# Patient Record
Sex: Female | Born: 1965 | Race: White | Hispanic: No | Marital: Married | Smoking: Never smoker
Health system: Southern US, Community
[De-identification: ages and names within clinical notes are randomized; demographics above are authoritative.]

## PROBLEM LIST (undated history)

## (undated) DIAGNOSIS — R51 Headache: Secondary | ICD-10-CM

## (undated) DIAGNOSIS — N390 Urinary tract infection, site not specified: Secondary | ICD-10-CM

## (undated) DIAGNOSIS — R002 Palpitations: Secondary | ICD-10-CM

## (undated) DIAGNOSIS — I491 Atrial premature depolarization: Secondary | ICD-10-CM

## (undated) DIAGNOSIS — R12 Heartburn: Secondary | ICD-10-CM

## (undated) DIAGNOSIS — R9431 Abnormal electrocardiogram [ECG] [EKG]: Secondary | ICD-10-CM

## (undated) DIAGNOSIS — I493 Ventricular premature depolarization: Secondary | ICD-10-CM

## (undated) DIAGNOSIS — R519 Headache, unspecified: Secondary | ICD-10-CM

## (undated) DIAGNOSIS — H9319 Tinnitus, unspecified ear: Secondary | ICD-10-CM

## (undated) DIAGNOSIS — R5383 Other fatigue: Secondary | ICD-10-CM

## (undated) DIAGNOSIS — I442 Atrioventricular block, complete: Secondary | ICD-10-CM

## (undated) HISTORY — DX: Headache, unspecified: R51.9

## (undated) HISTORY — DX: Palpitations: R00.2

## (undated) HISTORY — DX: Atrioventricular block, complete: I44.2

## (undated) HISTORY — DX: Tinnitus, unspecified ear: H93.19

## (undated) HISTORY — DX: Abnormal electrocardiogram (ECG) (EKG): R94.31

## (undated) HISTORY — DX: Atrial premature depolarization: I49.1

## (undated) HISTORY — PX: ABLATION: SHX5711

## (undated) HISTORY — PX: CHOLECYSTECTOMY: SHX55

## (undated) HISTORY — PX: DILATION AND CURETTAGE OF UTERUS: SHX78

## (undated) HISTORY — DX: Headache: R51

## (undated) HISTORY — DX: Ventricular premature depolarization: I49.3

## (undated) HISTORY — DX: Other fatigue: R53.83

## (undated) HISTORY — DX: Heartburn: R12

---

## 1998-07-22 ENCOUNTER — Emergency Department (HOSPITAL_COMMUNITY): Admission: EM | Admit: 1998-07-22 | Discharge: 1998-07-22 | Payer: Self-pay | Admitting: Emergency Medicine

## 2000-03-05 ENCOUNTER — Emergency Department (HOSPITAL_COMMUNITY): Admission: EM | Admit: 2000-03-05 | Discharge: 2000-03-05 | Payer: Self-pay | Admitting: Emergency Medicine

## 2000-04-06 ENCOUNTER — Encounter: Admission: RE | Admit: 2000-04-06 | Discharge: 2000-04-06 | Payer: Self-pay | Admitting: Family Medicine

## 2000-04-06 ENCOUNTER — Encounter: Payer: Self-pay | Admitting: Family Medicine

## 2000-12-01 ENCOUNTER — Encounter: Admission: RE | Admit: 2000-12-01 | Discharge: 2000-12-01 | Payer: Self-pay | Admitting: General Surgery

## 2000-12-01 ENCOUNTER — Encounter: Payer: Self-pay | Admitting: General Surgery

## 2001-01-02 ENCOUNTER — Emergency Department (HOSPITAL_COMMUNITY): Admission: EM | Admit: 2001-01-02 | Discharge: 2001-01-02 | Payer: Self-pay | Admitting: Emergency Medicine

## 2001-07-02 ENCOUNTER — Emergency Department (HOSPITAL_COMMUNITY): Admission: EM | Admit: 2001-07-02 | Discharge: 2001-07-03 | Payer: Self-pay | Admitting: *Deleted

## 2001-12-05 ENCOUNTER — Encounter: Payer: Self-pay | Admitting: General Surgery

## 2001-12-05 ENCOUNTER — Encounter: Admission: RE | Admit: 2001-12-05 | Discharge: 2001-12-05 | Payer: Self-pay | Admitting: General Surgery

## 2003-01-24 ENCOUNTER — Encounter: Payer: Self-pay | Admitting: Emergency Medicine

## 2003-01-24 ENCOUNTER — Inpatient Hospital Stay (HOSPITAL_COMMUNITY): Admission: EM | Admit: 2003-01-24 | Discharge: 2003-01-24 | Payer: Self-pay | Admitting: Emergency Medicine

## 2003-11-21 ENCOUNTER — Encounter: Admission: RE | Admit: 2003-11-21 | Discharge: 2003-11-21 | Payer: Self-pay | Admitting: General Surgery

## 2005-04-08 ENCOUNTER — Encounter: Admission: RE | Admit: 2005-04-08 | Discharge: 2005-04-08 | Payer: Self-pay | Admitting: General Surgery

## 2005-04-16 ENCOUNTER — Encounter: Admission: RE | Admit: 2005-04-16 | Discharge: 2005-04-16 | Payer: Self-pay | Admitting: General Surgery

## 2006-02-09 ENCOUNTER — Emergency Department (HOSPITAL_COMMUNITY): Admission: EM | Admit: 2006-02-09 | Discharge: 2006-02-09 | Payer: Self-pay | Admitting: Emergency Medicine

## 2006-06-20 ENCOUNTER — Encounter: Admission: RE | Admit: 2006-06-20 | Discharge: 2006-06-20 | Payer: Self-pay | Admitting: General Surgery

## 2006-06-29 ENCOUNTER — Encounter: Admission: RE | Admit: 2006-06-29 | Discharge: 2006-06-29 | Payer: Self-pay | Admitting: General Surgery

## 2006-11-03 ENCOUNTER — Emergency Department (HOSPITAL_COMMUNITY): Admission: EM | Admit: 2006-11-03 | Discharge: 2006-11-03 | Payer: Self-pay | Admitting: Emergency Medicine

## 2006-12-25 ENCOUNTER — Emergency Department (HOSPITAL_COMMUNITY): Admission: EM | Admit: 2006-12-25 | Discharge: 2006-12-25 | Payer: Self-pay | Admitting: Emergency Medicine

## 2006-12-26 ENCOUNTER — Emergency Department (HOSPITAL_COMMUNITY): Admission: EM | Admit: 2006-12-26 | Discharge: 2006-12-26 | Payer: Self-pay | Admitting: Emergency Medicine

## 2007-11-23 ENCOUNTER — Encounter: Admission: RE | Admit: 2007-11-23 | Discharge: 2007-11-23 | Payer: Self-pay | Admitting: Internal Medicine

## 2009-03-23 ENCOUNTER — Emergency Department (HOSPITAL_BASED_OUTPATIENT_CLINIC_OR_DEPARTMENT_OTHER): Admission: EM | Admit: 2009-03-23 | Discharge: 2009-03-23 | Payer: Self-pay | Admitting: Emergency Medicine

## 2009-03-27 ENCOUNTER — Encounter (HOSPITAL_COMMUNITY): Admission: RE | Admit: 2009-03-27 | Discharge: 2009-05-22 | Payer: Self-pay | Admitting: Ophthalmology

## 2009-06-18 ENCOUNTER — Encounter: Admission: RE | Admit: 2009-06-18 | Discharge: 2009-06-18 | Payer: Self-pay | Admitting: Internal Medicine

## 2010-10-11 ENCOUNTER — Encounter: Payer: Self-pay | Admitting: General Surgery

## 2011-02-05 NOTE — H&P (Signed)
NAME:  Kelsey Torres, Kelsey Torres                        ACCOUNT NO.:  [id number]   MEDICAL RECORD NO.:  [id number]                   PATIENT TYPE:  INP   LOCATION:  2016                                 FACILITY:  [facility]   PHYSICIAN:  Cristy Hilts. Jacinto Halim, M.D.                  DATE OF BIRTH:  1965-12-29   DATE OF ADMISSION:  01/23/2003  DATE OF DISCHARGE:  01/24/2003                                HISTORY & PHYSICAL   CHIEF COMPLAINT:  Chest pain.   HISTORY OF PRESENT ILLNESS:  The patient is a 45 year old female with no  prior history of coronary disease who presented to the emergency room at  [facility] last evening with epigastric pain.  She had  been having symptoms off and on for a week or two but these had worsened in  the last two to three days.  She describes epigastric burning that would go  up into her throat and into her back.  Yesterday she had some tingling in  her arms.  This concerned her and she presented to the emergency room.  In  the emergency room, the emergency room physician felt that her EKG had some  abnormalities with possibility of inferior ST depression.  She was admitted  to rule out MI.   PAST MEDICAL HISTORY:  This is unremarkable for diabetes or hyperlipidemia  or hypertension.  She has had gallbladder surgery in 1998.  She had a  thyroid biopsy a few weeks that was normal.   CURRENT MEDICATION:  Claritin D p.r.n.   ALLERGIES:  She has allergies to PENICILLIN, SULFA and CODEINE.   SOCIAL HISTORY:  She is married.  She has two children.  She does not work.   HABITS:  She does not smoke and does not drink.   FAMILY HISTORY:  This is unremarkable for coronary disease, both parents are  alive and well without serious medical problems.  Her grandmother does have  diabetes.   REVIEW OF SYMPTOMS:  This is essentially unremarkable except for as noted  above.  She does have chronic seasonal allergies.  She was to see Dr. Katrinka Blazing  in [place], [place], today regarding these symptoms she has been  having for the last two weeks with epigastric discomfort.   PHYSICAL EXAMINATION:  GENERAL APPEARANCE:  She is a well-developed, well-  nourished female in no acute distress.  VITAL SIGNS:  Stable.  Blood pressure 135/78, pulse 80, respiratory rate 16,  temperature 97.2, weight 135.  HEENT:  Normocephalic.  Extraocular movements are intact.  Sclerae are  anicteric.  Lids and conjunctivae are within normal limits.  NECK:  There are no bruits, no JVD.  CHEST:  Clear to auscultation and percussion.  CARDIOVASCULAR:  There is regular rate and rhythm without murmurs, rubs, or  gallops.  Normal S1 and S2.  ABDOMEN:  Nontender.  No hepatosplenomegaly is appreciated.  EXTREMITIES:  No edema.  Distal pulses are intact.   EKG reveals sinus rhythm with suggestion of subtle ST depression and  nonspecific ST changes.   LABORATORY DATA:  D-dimer is less than 0.22.  CK-MB and troponin are  negative x2.  White count 10.9, hemoglobin 13.5, hematocrit 40.3, platelets  234.  Sodium 136, potassium 3.3, BUN 9, creatinine 0.8.  Liver functions are  normal.  Lipase 21.   Chest x-ray was done in the emergency room and the report is not available  at the time of this dictation.   IMPRESSION:  1. Chest pain, more consistent with gastroesophageal reflux than coronary     disease.  2. Abnormal EKG with nonspecific ST changes.   PLAN:  The patient was seen by Dr. Jacinto Halim and myself today.  She will be sent  to our office for an outpatient Cardiolite study today.  We did add a PPI.     Abelino Derrick, P.A.                      Cristy Hilts. Jacinto Halim, M.D.    Lenard Lance  D:  01/24/2003  T:  01/25/2003  Job:  [id number]   cc:   Dellie Burns, M.D.  [place], [place]

## 2011-02-05 NOTE — Discharge Summary (Signed)
   NAME:  Kelsey Torres, Kelsey Torres                        ACCOUNT NO.:  [id number]   MEDICAL RECORD NO.:  [id number]                   PATIENT TYPE:  INP   LOCATION:  2016                                 FACILITY:  [facility]   PHYSICIAN:  Cristy Hilts. Jacinto Halim, M.D.                  DATE OF BIRTH:  February 28, 1966   DATE OF ADMISSION:  01/23/2003  DATE OF DISCHARGE:  01/24/2003                                 DISCHARGE SUMMARY   ADDENDUM:  As follows, apparently the patient never did have a chest x-ray  in the emergency room.      Abelino Derrick, P.A.                      Cristy Hilts. Jacinto Halim, M.D.    Lenard Lance  D:  01/24/2003  T:  01/25/2003  Job:  [id number]

## 2011-03-12 ENCOUNTER — Other Ambulatory Visit: Payer: Self-pay | Admitting: Obstetrics and Gynecology

## 2011-03-12 DIAGNOSIS — Z1231 Encounter for screening mammogram for malignant neoplasm of breast: Secondary | ICD-10-CM

## 2011-03-16 ENCOUNTER — Ambulatory Visit
Admission: RE | Admit: 2011-03-16 | Discharge: 2011-03-16 | Disposition: A | Discharge status: Home / Self Care | Payer: BC Managed Care – PPO | Source: Ambulatory Visit | Attending: Obstetrics and Gynecology | Admitting: Obstetrics and Gynecology

## 2011-03-16 ENCOUNTER — Ambulatory Visit: Payer: Self-pay

## 2011-03-16 DIAGNOSIS — Z1231 Encounter for screening mammogram for malignant neoplasm of breast: Secondary | ICD-10-CM

## 2013-12-03 ENCOUNTER — Other Ambulatory Visit: Payer: Self-pay

## 2013-12-03 ENCOUNTER — Ambulatory Visit
Admission: RE | Admit: 2013-12-03 | Discharge: 2013-12-03 | Disposition: A | Discharge status: Home / Self Care | Payer: BC Managed Care – PPO | Source: Ambulatory Visit

## 2013-12-03 DIAGNOSIS — Z1231 Encounter for screening mammogram for malignant neoplasm of breast: Secondary | ICD-10-CM

## 2014-07-06 DIAGNOSIS — Z658 Other specified problems related to psychosocial circumstances: Secondary | ICD-10-CM

## 2014-07-06 DIAGNOSIS — Z789 Other specified health status: Secondary | ICD-10-CM | POA: Insufficient documentation

## 2014-07-06 DIAGNOSIS — N393 Stress incontinence (female) (male): Secondary | ICD-10-CM | POA: Insufficient documentation

## 2014-07-06 DIAGNOSIS — M6281 Muscle weakness (generalized): Secondary | ICD-10-CM | POA: Insufficient documentation

## 2014-12-25 ENCOUNTER — Emergency Department (HOSPITAL_BASED_OUTPATIENT_CLINIC_OR_DEPARTMENT_OTHER)
Admission: EM | Admit: 2014-12-25 | Discharge: 2014-12-25 | Disposition: A | Discharge status: Home / Self Care | Payer: BLUE CROSS/BLUE SHIELD | Attending: Emergency Medicine | Admitting: Emergency Medicine

## 2014-12-25 ENCOUNTER — Encounter (HOSPITAL_BASED_OUTPATIENT_CLINIC_OR_DEPARTMENT_OTHER): Payer: Self-pay | Admitting: Emergency Medicine

## 2014-12-25 DIAGNOSIS — Z3202 Encounter for pregnancy test, result negative: Secondary | ICD-10-CM | POA: Diagnosis not present

## 2014-12-25 DIAGNOSIS — R319 Hematuria, unspecified: Secondary | ICD-10-CM

## 2014-12-25 DIAGNOSIS — N390 Urinary tract infection, site not specified: Secondary | ICD-10-CM | POA: Diagnosis not present

## 2014-12-25 DIAGNOSIS — Z880 Allergy status to penicillin: Secondary | ICD-10-CM | POA: Diagnosis not present

## 2014-12-25 DIAGNOSIS — R350 Frequency of micturition: Secondary | ICD-10-CM | POA: Diagnosis present

## 2014-12-25 HISTORY — DX: Urinary tract infection, site not specified: N39.0

## 2014-12-25 LAB — URINE MICROSCOPIC-ADD ON

## 2014-12-25 LAB — URINALYSIS, ROUTINE W REFLEX MICROSCOPIC
Glucose, UA: NEGATIVE mg/dL
KETONES UR: 15 mg/dL — AB
NITRITE: NEGATIVE
PH: 5.5 (ref 5.0–8.0)
PROTEIN: 100 mg/dL — AB
Specific Gravity, Urine: 1.018 (ref 1.005–1.030)
Urobilinogen, UA: 1 mg/dL (ref 0.0–1.0)

## 2014-12-25 LAB — PREGNANCY, URINE: Preg Test, Ur: NEGATIVE

## 2014-12-25 MED ORDER — NITROFURANTOIN MONOHYD MACRO 100 MG PO CAPS: 100.0000 mg | ORAL_CAPSULE | Freq: Two times a day (BID) | ORAL | Status: DC

## 2014-12-25 MED ORDER — NITROFURANTOIN MONOHYD MACRO 100 MG PO CAPS
100.0000 mg | ORAL_CAPSULE | Freq: Once | ORAL | Status: AC
  Administered 2014-12-25: 100 mg via ORAL
  Filled 2014-12-25: qty 1

## 2014-12-25 MED ORDER — PHENAZOPYRIDINE HCL 100 MG PO TABS
200.0000 mg | ORAL_TABLET | Freq: Once | ORAL | Status: AC
  Administered 2014-12-25: 200 mg via ORAL
  Filled 2014-12-25: qty 2

## 2014-12-25 MED ORDER — PHENAZOPYRIDINE HCL 200 MG PO TABS: 200.0000 mg | ORAL_TABLET | Freq: Three times a day (TID) | ORAL | Status: DC

## 2014-12-25 NOTE — Discharge Instructions (Signed)
Urinary Tract Infection °Urinary tract infections (UTIs) can develop anywhere along your urinary tract. Your urinary tract is your body's drainage system for removing wastes and extra water. Your urinary tract includes two kidneys, two ureters, a bladder, and a urethra. Your kidneys are a pair of bean-shaped organs. Each kidney is about the size of your fist. They are located below your ribs, one on each side of your spine. °CAUSES °Infections are caused by microbes, which are microscopic organisms, including fungi, viruses, and bacteria. These organisms are so small that they can only be seen through a microscope. Bacteria are the microbes that most commonly cause UTIs. °SYMPTOMS  °Symptoms of UTIs may vary by age and gender of the patient and by the location of the infection. Symptoms in young women typically include a frequent and intense urge to urinate and a painful, burning feeling in the bladder or urethra during urination. Older women and men are more likely to be tired, shaky, and weak and have muscle aches and abdominal pain. A fever may mean the infection is in your kidneys. Other symptoms of a kidney infection include pain in your back or sides below the ribs, nausea, and vomiting. °DIAGNOSIS °To diagnose a UTI, your caregiver will ask you about your symptoms. Your caregiver also will ask to provide a urine sample. The urine sample will be tested for bacteria and white blood cells. White blood cells are made by your body to help fight infection. °TREATMENT  °Typically, UTIs can be treated with medication. Because most UTIs are caused by a bacterial infection, they usually can be treated with the use of antibiotics. The choice of antibiotic and length of treatment depend on your symptoms and the type of bacteria causing your infection. °HOME CARE INSTRUCTIONS °· If you were prescribed antibiotics, take them exactly as your caregiver instructs you. Finish the medication even if you feel better after you  have only taken some of the medication. °· Drink enough water and fluids to keep your urine clear or pale yellow. °· Avoid caffeine, tea, and carbonated beverages. They tend to irritate your bladder. °· Empty your bladder often. Avoid holding urine for long periods of time. °· Empty your bladder before and after sexual intercourse. °· After a bowel movement, women should cleanse from front to back. Use each tissue only once. °SEEK MEDICAL CARE IF:  °· You have back pain. °· You develop a fever. °· Your symptoms do not begin to resolve within 3 days. °SEEK IMMEDIATE MEDICAL CARE IF:  °· You have severe back pain or lower abdominal pain. °· You develop chills. °· You have nausea or vomiting. °· You have continued burning or discomfort with urination. °MAKE SURE YOU:  °· Understand these instructions. °· Will watch your condition. °· Will get help right away if you are not doing well or get worse. °Document Released: 06/16/2005 Document Revised: 03/07/2012 Document Reviewed: 10/15/2011 °ExitCare® Patient Information ©2015 [employer]. This information is not intended to replace advice given to you by your health care provider. Make sure you discuss any questions you have with your health care provider. ° °Nitrofurantoin tablets or capsules °What is this medicine? °NITROFURANTOIN (nye troe fyoor AN toyn) is an antibiotic. It is used to treat urinary tract infections. °This medicine may be used for other purposes; ask your health care provider or pharmacist if you have questions. °COMMON BRAND NAME(S): Macrobid, Macrodantin, Urotoin °What should I tell my health care provider before I take this medicine? °They need to   know if you have any of these conditions: -anemia -diabetes -glucose-6-phosphate dehydrogenase deficiency -kidney disease -liver disease -lung disease -other chronic illness -an unusual or allergic reaction to nitrofurantoin, other antibiotics, other medicines, foods, dyes or  preservatives -pregnant or trying to get pregnant -breast-feeding How should I use this medicine? Take this medicine by mouth with a glass of water. Follow the directions on the prescription label. Take this medicine with food or milk. Take your doses at regular intervals. Do not take your medicine more often than directed. Do not stop taking except on your doctor's advice. Talk to your pediatrician regarding the use of this medicine in children. While this drug may be prescribed for selected conditions, precautions do apply. Overdosage: If you think you have taken too much of this medicine contact a poison control center or emergency room at once. NOTE: This medicine is only for you. Do not share this medicine with others. What if I miss a dose? If you miss a dose, take it as soon as you can. If it is almost time for your next dose, take only that dose. Do not take double or extra doses. What may interact with this medicine? -antacids containing magnesium trisilicate -probenecid -quinolone antibiotics like ciprofloxacin, lomefloxacin, norfloxacin and ofloxacin -sulfinpyrazone This list may not describe all possible interactions. Give your health care provider a list of all the medicines, herbs, non-prescription drugs, or dietary supplements you use. Also tell them if you smoke, drink alcohol, or use illegal drugs. Some items may interact with your medicine. What should I watch for while using this medicine? Tell your doctor or health care professional if your symptoms do not improve or if you get new symptoms. Drink several glasses of water a day. If you are taking this medicine for a long time, visit your doctor for regular checks on your progress. If you are diabetic, you may get a false positive result for sugar in your urine with certain brands of urine tests. Check with your doctor. What side effects may I notice from receiving this medicine? Side effects that you should report to your  doctor or health care professional as soon as possible: -allergic reactions like skin rash or hives, swelling of the face, lips, or tongue -chest pain -cough -difficulty breathing -dizziness, drowsiness -fever or infection -joint aches or pains -pale or blue-tinted skin -redness, blistering, peeling or loosening of the skin, including inside the mouth -tingling, burning, pain, or numbness in hands or feet -unusual bleeding or bruising -unusually weak or tired -yellowing of eyes or skin Side effects that usually do not require medical attention (report to your doctor or health care professional if they continue or are bothersome): -dark urine -diarrhea -headache -loss of appetite -nausea or vomiting -temporary hair loss This list may not describe all possible side effects. Call your doctor for medical advice about side effects. You may report side effects to FDA at 1-800-FDA-1088. Where should I keep my medicine? Keep out of the reach of children. Store at room temperature between 15 and 30 degrees C (59 and 86 degrees F). Protect from light. Throw away any unused medicine after the expiration date. NOTE: This sheet is a summary. It may not cover all possible information. If you have questions about this medicine, talk to your doctor, pharmacist, or health care provider.  2015, Elsevier/Gold Standard. (2008-03-27 15:56:47)  Phenazopyridine tablets What is this medicine? PHENAZOPYRIDINE (fen az oh PEER i deen) is a pain reliever. It is used to stop the pain,  burning, or discomfort caused by infection or irritation of the urinary tract. This medicine is not an antibiotic. It will not cure a urinary tract infection. This medicine may be used for other purposes; ask your health care provider or pharmacist if you have questions. COMMON BRAND NAME(S): AZO, Azo-100, Azo-Gesic, Azo-Septic, Azo-Standard, Phenazo, Prodium, Pyridium, Urinary Analgesic, Uristat What should I tell my health care  provider before I take this medicine? They need to know if you have any of these conditions: -glucose-6-phosphate dehydrogenase (G6PD) deficiency -kidney disease -an unusual or allergic reaction to phenazopyridine, other medicines, foods, dyes, or preservatives -pregnant or trying to get pregnant -breast-feeding How should I use this medicine? Take this medicine by mouth with a glass of water. Follow the directions on the prescription label. Take after meals. Take your doses at regular intervals. Do not take your medicine more often than directed. Do not skip doses or stop your medicine early even if you feel better. Do not stop taking except on your doctor's advice. Talk to your pediatrician regarding the use of this medicine in children. Special care may be needed. Overdosage: If you think you have taken too much of this medicine contact a poison control center or emergency room at once. NOTE: This medicine is only for you. Do not share this medicine with others. What if I miss a dose? If you miss a dose, take it as soon as you can. If it is almost time for your next dose, take only that dose. Do not take double or extra doses. What may interact with this medicine? Interactions are not expected. This list may not describe all possible interactions. Give your health care provider a list of all the medicines, herbs, non-prescription drugs, or dietary supplements you use. Also tell them if you smoke, drink alcohol, or use illegal drugs. Some items may interact with your medicine. What should I watch for while using this medicine? Tell your doctor or health care professional if your symptoms do not improve or if they get worse. This medicine colors body fluids red. This effect is harmless and will go away after you are done taking the medicine. It will change urine to an dark orange or red color. The red color may stain clothing. Soft contact lenses may become permanently stained. It is best not to  wear soft contact lenses while taking this medicine. If you are diabetic you may get a false positive result for sugar in your urine. Talk to your health care provider. What side effects may I notice from receiving this medicine? Side effects that you should report to your doctor or health care professional as soon as possible: -allergic reactions like skin rash, itching or hives, swelling of the face, lips, or tongue -blue or purple color of the skin -difficulty breathing -fever -less urine -unusual bleeding, bruising -unusual tired, weak -vomiting -yellowing of the eyes or skin Side effects that usually do not require medical attention (report to your doctor or health care professional if they continue or are bothersome): -dark urine -headache -stomach upset This list may not describe all possible side effects. Call your doctor for medical advice about side effects. You may report side effects to FDA at 1-800-FDA-1088. Where should I keep my medicine? Keep out of the reach of children. Store at room temperature between 15 and 30 degrees C (59 and 86 degrees F). Protect from light and moisture. Throw away any unused medicine after the expiration date. NOTE: This sheet is a summary. It  may not cover all possible information. If you have questions about this medicine, talk to your doctor, pharmacist, or health care provider.  2015, Elsevier/Gold Standard. (2008-04-04 11:04:07)

## 2014-12-25 NOTE — ED Provider Notes (Signed)
CSN: [id number]     Arrival date & time 12/25/14  0252 History   First MD Initiated Contact with Patient 12/25/14 0435     Chief Complaint  Patient presents with  . Urinary Frequency     (Consider location/radiation/quality/duration/timing/severity/associated sxs/prior Treatment) Patient is a 49 y.o. female presenting with frequency. The history is provided by the patient.  Urinary Frequency  She woke up with sense of urinary urgency and frequency and also noted dysuria. She noticed blood when she wiped.There's been some mild suprapubic pain but no flank pain. There's been no nausea or vomiting. There's been no fever, chills, sweats. She does have history of frequent UTIs. She does relate a recent upper endoscopy with sedation with propofol and wonders if that might be related.  Past Medical History  Diagnosis Date  . UTI (lower urinary tract infection)    History reviewed. No pertinent past surgical history. History reviewed. No pertinent family history. History  Substance Use Topics  . Smoking status: Never Smoker   . Smokeless tobacco: Not on file  . Alcohol Use: No   OB History    No data available     Review of Systems  Genitourinary: Positive for frequency.  All other systems reviewed and are negative.     Allergies  Clindamycin/lincomycin; Penicillins; and Sulfa antibiotics  Home Medications   Prior to Admission medications   Not on File   BP 132/83 mmHg  Pulse 94  Resp 18  Ht 5\' 4"  (1.626 m)  Wt 146 lb (66.225 kg)  BMI 25.05 kg/m2  SpO2 100%  LMP 11/24/2014 (Approximate) Physical Exam  Nursing note and vitals reviewed.  49 year old female, resting comfortably and in no acute distress. Vital signs are normal. Oxygen saturation is 100%, which is normal. Head is normocephalic and atraumatic. PERRLA, EOMI. Oropharynx is clear. Neck is nontender and supple without adenopathy or JVD. Back is nontender and there is no CVA tenderness. Lungs are clear  without rales, wheezes, or rhonchi. Chest is nontender. Heart has regular rate and rhythm without murmur. Abdomen is soft, flat, nontender without masses or hepatosplenomegaly and peristalsis is normoactive. Extremities have no cyanosis or edema, full range of motion is present. Skin is warm and dry without rash. Neurologic: Mental status is normal, cranial nerves are intact, there are no motor or sensory deficits.  ED Course  Procedures (including critical care time) Labs Review Results for orders placed or performed during the hospital encounter of 12/25/14  Urinalysis, Routine w reflex microscopic  Result Value Ref Range   Color, Urine RED (A) YELLOW   APPearance TURBID (A) CLEAR   Specific Gravity, Urine 1.018 1.005 - 1.030   pH 5.5 5.0 - 8.0   Glucose, UA NEGATIVE NEGATIVE mg/dL   Hgb urine dipstick LARGE (A) NEGATIVE   Bilirubin Urine MODERATE (A) NEGATIVE   Ketones, ur 15 (A) NEGATIVE mg/dL   Protein, ur 865100 (A) NEGATIVE mg/dL   Urobilinogen, UA 1.0 0.0 - 1.0 mg/dL   Nitrite NEGATIVE NEGATIVE   Leukocytes, UA LARGE (A) NEGATIVE  Pregnancy, urine  Result Value Ref Range   Preg Test, Ur NEGATIVE NEGATIVE  Urine microscopic-add on  Result Value Ref Range   Squamous Epithelial / LPF FEW (A) RARE   WBC, UA TOO NUMEROUS TO COUNT <3 WBC/hpf   RBC / HPF TOO NUMEROUS TO COUNT <3 RBC/hpf   Bacteria, UA MANY (A) RARE     MDM   Final diagnoses:  Urinary tract infection with hematuria,  site unspecified    Urinary tract infection. She is discharged with prescription for nitrofurantoin and also denies it. August Saucer. Follow-up with PCP after completion of antibiotic course to make sure that infection has cleared.    Dione Booze, MD 12/25/14 [phone]

## 2014-12-25 NOTE — ED Notes (Signed)
Patient states that she woke up with burning with urination and frequency. Frequent UTIs in the past does well with macrobid per the patient

## 2014-12-27 LAB — URINE CULTURE

## 2014-12-29 NOTE — ED Notes (Signed)
Called [facility] and left message for diflucan 150 mg, one tab, per Celene Skeenobyn Hess. Patient called and requested for yeast infection resulting from taking macrobid for UTI prescribed 12/25/14

## 2014-12-30 ENCOUNTER — Telehealth (HOSPITAL_COMMUNITY): Payer: Self-pay

## 2014-12-30 NOTE — Telephone Encounter (Signed)
Post ED Visit - Positive Culture Follow-up  Culture report reviewed by antimicrobial stewardship pharmacist: []  Wes Dulaney, Pharm.D., BCPS []  Celedonio MiyamotoJeremy Frens, [address] BCPS [x]  Georgina PillionElizabeth Martin, [address] BCPS []  ColdironMinh Pham, [address] BCPS, AAHIVP []  Estella HuskMichelle Turner, Pharm.D., BCPS, AAHIVP []  Elder CyphersLorie Poole, [address] BCPS  Positive Urine culture, >/= 100,000 colonies -> E Coli Treated with Nitrofurantoin, organism sensitive to the same and no further patient follow-up is required at this time.  Arvid RightClark, Kinaya Hilliker Dorn 12/30/2014, 10:15 PM

## 2017-01-12 ENCOUNTER — Other Ambulatory Visit: Payer: Self-pay | Admitting: Obstetrics and Gynecology

## 2017-01-12 DIAGNOSIS — Z1231 Encounter for screening mammogram for malignant neoplasm of breast: Secondary | ICD-10-CM

## 2017-01-17 ENCOUNTER — Ambulatory Visit
Admission: RE | Admit: 2017-01-17 | Discharge: 2017-01-17 | Disposition: A | Discharge status: Home / Self Care | Payer: BLUE CROSS/BLUE SHIELD | Source: Ambulatory Visit | Attending: Obstetrics and Gynecology | Admitting: Obstetrics and Gynecology

## 2017-01-17 DIAGNOSIS — Z1231 Encounter for screening mammogram for malignant neoplasm of breast: Secondary | ICD-10-CM

## 2017-04-11 ENCOUNTER — Emergency Department (HOSPITAL_BASED_OUTPATIENT_CLINIC_OR_DEPARTMENT_OTHER)
Admission: EM | Admit: 2017-04-11 | Discharge: 2017-04-11 | Disposition: A | Discharge status: Home / Self Care | Payer: BLUE CROSS/BLUE SHIELD | Attending: Emergency Medicine | Admitting: Emergency Medicine

## 2017-04-11 ENCOUNTER — Encounter (HOSPITAL_BASED_OUTPATIENT_CLINIC_OR_DEPARTMENT_OTHER): Payer: Self-pay | Admitting: *Deleted

## 2017-04-11 DIAGNOSIS — R002 Palpitations: Secondary | ICD-10-CM | POA: Diagnosis present

## 2017-04-11 DIAGNOSIS — Z79899 Other long term (current) drug therapy: Secondary | ICD-10-CM | POA: Diagnosis not present

## 2017-04-11 NOTE — ED Provider Notes (Signed)
[facility] Provider Note   CSN: [id number] Arrival date & time: 04/11/17  1341  By signing my name below, I, Linna Darner, attest that this documentation has been prepared under the direction and in the presence of physician practitioner, Rolan Bucco, MD. Electronically Signed: Linna Darner, Scribe. 04/11/2017. 3:28 PM.  History   Chief Complaint Chief Complaint  Patient presents with  . Palpitations   The history is provided by the patient. No language interpreter was used.    HPI Comments: Kelsey Torres is a 51 y.o. female with no pertinent PMHx who presents to the Emergency Department complaining of intermittent palpitations beginning 5 days ago. Patient states it feels like her heart is occasionally "skipping a beat". She reports some belching shortly after her palpitations present. She occasionally has lightheadedness, but this is not associated with the palpitations. She has no prior h/o the same. Patient was evaluated four days ago by her PCP and her labs and EKG were normal. She was given a referral to cardiology but could not schedule an appointment until October. No recent medications changes. She denies excessive or frequent caffeine intake. Patient further denies chest pain, leg swelling, cough, congestion, rhinorrhea, fevers, chills, nausea, vomiting, or any other associated symptoms.  Past Medical History:  Diagnosis Date  . UTI (lower urinary tract infection)     There are no active problems to display for this patient.   Past Surgical History:  Procedure Laterality Date  . CHOLECYSTECTOMY      OB History    No data available       Home Medications    Prior to Admission medications   Medication Sig Start Date End Date Taking? Authorizing Provider  mometasone (NASONEX) 50 MCG/ACT nasal spray Place 2 sprays into the nose daily.   Yes [provider]  Pantoprazole Sodium (PROTONIX PO) Take by mouth.   Yes [provider]    nitrofurantoin, macrocrystal-monohydrate, (MACROBID) 100 MG capsule Take 1 capsule (100 mg total) by mouth 2 (two) times daily. X 7 days 12/25/14   Dione Booze, MD  phenazopyridine (PYRIDIUM) 200 MG tablet Take 1 tablet (200 mg total) by mouth 3 (three) times daily. 12/25/14   Dione Booze, MD    Family History No family history on file.  Social History Social History  Substance Use Topics  . Smoking status: Never Smoker  . Smokeless tobacco: Not on file  . Alcohol use No     Allergies   Clindamycin/lincomycin; Penicillins; and Sulfa antibiotics   Review of Systems Review of Systems  Constitutional: Negative for chills, diaphoresis, fatigue and fever.  HENT: Negative for congestion, rhinorrhea and sneezing.   Eyes: Negative.   Respiratory: Negative for cough, chest tightness and shortness of breath.   Cardiovascular: Positive for palpitations. Negative for chest pain and leg swelling.  Gastrointestinal: Negative for abdominal pain, blood in stool, diarrhea, nausea and vomiting.  Genitourinary: Negative for difficulty urinating, flank pain, frequency and hematuria.  Musculoskeletal: Negative for arthralgias and back pain.  Skin: Negative for rash.  Neurological: Negative for dizziness, speech difficulty, weakness, numbness and headaches.   Physical Exam Updated Vital Signs BP (!) 177/91   Pulse 80   Temp 98 F (36.7 C)   Resp 16   Ht 5\' 4"  (1.626 m)   Wt 77.1 kg (170 lb)   SpO2 100%   BMI 29.18 kg/m   Physical Exam  Constitutional: She is oriented to person, place, and time. She appears well-developed and well-nourished.  HENT:  Head: Normocephalic and atraumatic.  Eyes: Pupils are equal, round, and reactive to light.  Neck: Normal range of motion. Neck supple.  Cardiovascular: Normal rate, regular rhythm and normal heart sounds.   Pulmonary/Chest: Effort normal and breath sounds normal. No respiratory distress. She has no wheezes. She has no rales. She exhibits no  tenderness.  Abdominal: Soft. Bowel sounds are normal. There is no tenderness. There is no rebound and no guarding.  Musculoskeletal: Normal range of motion. She exhibits no edema.  Lymphadenopathy:    She has no cervical adenopathy.  Neurological: She is alert and oriented to person, place, and time.  Skin: Skin is warm and dry. No rash noted.  Psychiatric: She has a normal mood and affect.   ED Treatments / Results  Labs (all labs ordered are listed, but only abnormal results are displayed) Labs Reviewed - No data to display  EKG  EKG Interpretation  Date/Time:  Monday April 11 2017 13:54:28 EDT Ventricular Rate:  72 PR Interval:  158 QRS Duration: 70 QT Interval:  366 QTC Calculation: 400 R Axis:   73 Text Interpretation:  Normal sinus rhythm Nonspecific ST and T wave abnormality Abnormal ECG since last tracing no significant change Confirmed by Rolan BuccoBelfi, Keeghan Bialy [phone]([id number]) on 04/11/2017 3:12:24 PM       Radiology No results found.  Procedures Procedures (including critical care time)  DIAGNOSTIC STUDIES: Oxygen Saturation is 100% on RA, normal by my interpretation.    COORDINATION OF CARE: 3:17 PM Discussed treatment plan with pt at bedside and pt agreed to plan.  Medications Ordered in ED Medications - No data to display   Initial Impression / Assessment and Plan / ED Course  I have reviewed the triage vital signs and the nursing notes.  Pertinent labs & imaging results that were available during my care of the patient were reviewed by me and considered in my medical decision making (see chart for details).     Patient presents with palpitations. She has no associated chest pain, shortness of breath or dizziness. She is in a sinus rhythm currently. No ectopic beats are noted. She had recent blood work including CBC, chemistry and thyroid panel which were reviewed by the patient states that they were all normal. I am unable to pull up this blood work. It was done on  July 19th through her PCP with [facility]. She has been referred to cardiology but is unable to get an appointment until October. She's currently not having symptoms. I will refer her to one of the cardiologists with [facility]. She was given return precautions.  Final Clinical Impressions(s) / ED Diagnoses   Final diagnoses:  Palpitations    New Prescriptions New Prescriptions   No medications on file   I personally performed the services described in this documentation, which was scribed in my presence.  The recorded information has been reviewed and considered.    Rolan BuccoBelfi, Lester Platas, MD 04/11/17 1537

## 2017-04-11 NOTE — ED Triage Notes (Addendum)
Pt c/o heart palpations x 1 week, seen by PMD 7/18 for same labs and EKG done, referred  to cardiology, and has not sched appt.

## 2017-04-13 ENCOUNTER — Ambulatory Visit (INDEPENDENT_AMBULATORY_CARE_PROVIDER_SITE_OTHER): Payer: BLUE CROSS/BLUE SHIELD | Admitting: Cardiology

## 2017-04-13 ENCOUNTER — Encounter: Payer: Self-pay | Admitting: Cardiology

## 2017-04-13 VITALS — BP 130/76 | HR 84 | Ht 64.0 in | Wt 169.6 lb

## 2017-04-13 DIAGNOSIS — R002 Palpitations: Secondary | ICD-10-CM | POA: Diagnosis not present

## 2017-04-13 NOTE — Patient Instructions (Signed)
Medication Instructions:  Your physician recommends that you continue on your current medications as directed. Please refer to the Current Medication list given to you today.   Labwork: None ordered  Testing/Procedures: Your physician has requested that you have an echocardiogram. Echocardiography is a painless test that uses sound waves to create images of your heart. It provides your doctor with information about the size and shape of your heart and how well your heart's chambers and valves are working. This procedure takes approximately one hour. There are no restrictions for this procedure.    Follow-Up: Your physician wants you to follow-up AS NEEDED   Any Other Special Instructions Will Be Listed Below (If Applicable).     If you need a refill on your cardiac medications before your next appointment, please call your pharmacy.   

## 2017-04-13 NOTE — Progress Notes (Signed)
Cardiology Office Note:    Date:  04/13/2017   ID:  Kelsey CrandallMelissa M Dimock, DOB 07/27/1966, MRN [id number]  PCP:  Olivia Mackieho, William, MD  Cardiologist:  Donato SchultzMark Skains, MD    Referring MD: Vivien Prestoorrington, Kip A, MD     History of Present Illness:    Kelsey Torres is a 51 y.o. female here for evaluation of palpitations at the request of Dr. Theodoro Gristho.  In review of prior office note she was having fatigue and palpitations categorized as a fluttering sensation. Feels stop for a brief second then thump. +Fatigue, easy to increase HR. Neck symptoms. Dizzy. Took breaths and declined. Feels achey in arm and legs. She also noted some heartburn as well. Has been on proton pump inhibitor. She has headaches, ringing in her ears since a [employer] meter was placed on her house. She has had a prior cardiac workup with a heart monitor proximally 15 years ago with irregular heartbeat. No changes in medications. She had requested a note stating that she was sensitive to radiofrequencies needed to have the meter removed.  EKG from 04/07/17 personally reviewed shows sinus rhythm 79 with nonspecific T-wave flattening.  Past Medical History:  Diagnosis Date  . Fatigue   . Headache   . Heartburn   . Palpitations   . Tinnitus   . UTI (lower urinary tract infection)     Past Surgical History:  Procedure Laterality Date  . ABLATION    . CHOLECYSTECTOMY    . DILATION AND CURETTAGE OF UTERUS      Current Medications: Current Meds  Medication Sig  . loratadine (CLARITIN) 10 MG tablet Take 10 mg by mouth daily as needed for allergies.  . mometasone (NASONEX) 50 MCG/ACT nasal spray Place 2 sprays into the nose daily as needed.   . Pantoprazole Sodium (PROTONIX PO) Take 1 tablet by mouth daily.      Allergies:   Clindamycin/lincomycin; Penicillins; and Sulfa antibiotics   Social History   Social History  . Marital status: Married    Spouse name: N/A  . Number of children: N/A  . Years of education: N/A   Social  History Main Topics  . Smoking status: Never Smoker  . Smokeless tobacco: Never Used  . Alcohol use No  . Drug use: No  . Sexual activity: Not Asked   Other Topics Concern  . None   Social History Narrative  . None     Family History: The patient's family history includes Cancer - Ovarian in her paternal grandmother; Healthy in her father and mother. No early CAD ROS:   Please see the history of present illness.    Occasional whoozy, no bleeding, +smoring.  All other systems reviewed and are negative.  EKGs/Labs/Other Studies Reviewed:    The following studies were reviewed today: Prior office notes, lab work, EKG reviewed  EKG:  EKG is not ordered today.  04/07/17 shows sinus rhythm 79 with nonspecific T-wave flattening.  Telemetry in emergency department did not show any adverse arrhythmias per patient. She did feel an episode during that time.  Recent Labs: No results found for requested labs within last 8760 hours.  Recent Lipid Panel No results found for: CHOL, TRIG, HDL, CHOLHDL, VLDL, LDLCALC, LDLDIRECT  Physical Exam:    VS:  BP 130/76 (BP Location: Right Arm)   Pulse 84   Ht 5\' 4"  (1.626 m)   Wt 169 lb 9.6 oz (76.9 kg)   LMP  (LMP Unknown)   BMI 29.11 kg/m  Wt Readings from Last 3 Encounters:  04/13/17 169 lb 9.6 oz (76.9 kg)  04/11/17 170 lb (77.1 kg)  12/25/14 146 lb (66.2 kg)     GEN:  Well nourished, well developed in no acute distress HEENT: Normal NECK: No JVD; No carotid bruits LYMPHATICS: No lymphadenopathy CARDIAC: RRR, no murmurs, rubs, gallops RESPIRATORY:  Clear to auscultation without rales, wheezing or rhonchi  ABDOMEN: Soft, non-tender, non-distended MUSCULOSKELETAL:  No edema; No deformity  SKIN: Warm and dry NEUROLOGIC:  Alert and oriented x 3 PSYCHIATRIC:  Normal affect   ASSESSMENT:    1. Palpitation    PLAN:    In order of problems listed above:  Palpitations  - Her symptoms are fairly classic for PVCs or PACs. She  describes a classic flip-flop, fluidlike sensation after her pause between PVC and normal beat. She also experiences a subtle feeling in her neck, likely reverberation into her jugular.  - We will check an echocardiogram to ensure proper structure and function of her heart  - Conservative management for now. Palpitations such as this are fairly common surrounding menopause. No high-risk symptoms such as syncope.  - If her symptoms worsen or become worrisome, we always place a monitor at that time or consider use of metoprolol for instance.    Medication Adjustments/Labs and Tests Ordered: Current medicines are reviewed at length with the patient today.  Concerns regarding medicines are outlined above.  Orders Placed This Encounter  Procedures  . ECHOCARDIOGRAM COMPLETE   No orders of the defined types were placed in this encounter.   Signed, Donato SchultzMark Skains, MD  04/13/2017 2:19 PM    [facility]

## 2017-04-15 ENCOUNTER — Other Ambulatory Visit: Payer: Self-pay

## 2017-04-15 ENCOUNTER — Ambulatory Visit (HOSPITAL_COMMUNITY): Payer: BLUE CROSS/BLUE SHIELD | Attending: Cardiology

## 2017-04-15 DIAGNOSIS — R002 Palpitations: Secondary | ICD-10-CM | POA: Insufficient documentation

## 2017-04-15 DIAGNOSIS — I071 Rheumatic tricuspid insufficiency: Secondary | ICD-10-CM | POA: Insufficient documentation

## 2017-05-16 ENCOUNTER — Encounter: Payer: Self-pay | Admitting: Physician Assistant

## 2017-05-16 ENCOUNTER — Telehealth: Payer: Self-pay | Admitting: Cardiology

## 2017-05-16 NOTE — Progress Notes (Signed)
Cardiology Office Note    Date:  05/17/2017  ID:  Kelsey Torres, DOB 04/13/1966, MRN [id number] PCP:  Vivien Presto, MD  Cardiologist:  Dr. Anne Fu   Chief Complaint: chest pain  History of Present Illness:  Kelsey Torres is a 51 y.o. female with history of heartburn, palpitations, fatigue, tinnitus who presents for evaluation of chest pain. She has prior history of heart palpitations. Dr. Anne Fu reports prior workup with heart monitor 15 years ago with irregular heartbeat, unclear further details but no med changes were made. Per his last OV in 03/2017, she was reporting headache & ringing in her ears since a [employer] meter was placed on her house. At that visit she was reporting a sensation of episodic thump/flip felt c/w PACs, PVCs. Conserative management recommended, with recommendation to consider repeat monitoring for accelerating symptoms. 2D Echo 03/2017 showed EF 60-65%, normal diastolic function. No recent labs since 2016 and no non-GU labs available to review.  She presents back today to discuss increase in palpitations recently. She is here with her mother. She's noticed that any time she gets her heart rate up, she feels a sensation of butterflies in her chest, described as increased skipping. This improves with rest but she has also noticed it while doing nothing at all as well. She has not had any dizziness or syncope. She has also noticed rare sensation of chest discomfort in the left side of her chest described as a "cold sensation." It is a very focal sensation on her left chest wall and goes to a pinpoint sensation in her back at times. This sometimes goes down her left arm. On some occasions, she's burped and the sensation resolves completely. There have been other times where burping does not make a difference. She has noticed a particular association of this discomfort to after eating meals. This has also happened both with exertion and with rest, but not every time she  exerts herself. She had the discomfort for most of the day yesterday to a low grade. It was not worse with activity, inspiration or palpation. No associated nausea, vomiting, diaphoresis, clamminess. She feels well today. No history of smoking or family history of heart disease.    Past Medical History:  Diagnosis Date  . Fatigue   . Headache    a. pt reports prior sensitivity to radiofrequencies from [employer] meter in home.  Marland Kitchen Heartburn   . Palpitations   . Tinnitus   . UTI (lower urinary tract infection)     Past Surgical History:  Procedure Laterality Date  . ABLATION    . CHOLECYSTECTOMY    . DILATION AND CURETTAGE OF UTERUS      Current Medications: Current Meds  Medication Sig  . loratadine (CLARITIN) 10 MG tablet Take 10 mg by mouth daily as needed for allergies.  . mometasone (NASONEX) 50 MCG/ACT nasal spray Place 2 sprays into the nose daily as needed.   . Pantoprazole Sodium (PROTONIX PO) Take 1 tablet by mouth daily.      Allergies:   Clindamycin/lincomycin; Penicillins; and Sulfa antibiotics   Social History   Social History  . Marital status: Married    Spouse name: N/A  . Number of children: N/A  . Years of education: N/A   Social History Main Topics  . Smoking status: Never Smoker  . Smokeless tobacco: Never Used  . Alcohol use No  . Drug use: No  . Sexual activity: Not on file   Other  Topics Concern  . Not on file   Social History Narrative  . No narrative on file     Family History:  Family History  Problem Relation Age of Onset  . Healthy Mother   . Healthy Father   . Cancer - Ovarian Paternal Grandmother     ROS:   Please see the history of present illness.  All other systems are reviewed and otherwise negative.    PHYSICAL EXAM:   VS:  BP 132/88   Pulse 78   Ht 5\' 4"  (1.626 m)   Wt 167 lb (75.8 kg)   SpO2 98%   BMI 28.67 kg/m   BMI: Body mass index is 28.67 kg/m. GEN: Well nourished, well developed WF in no acute  distress  HEENT: normocephalic, atraumatic Neck: no JVD, carotid bruits, or masses Cardiac: RRR; no murmurs, rubs, or gallops, no edema  Respiratory:  clear to auscultation bilaterally, normal work of breathing GI: soft, nontender, nondistended, + BS MS: no deformity or atrophy  Skin: warm and dry, no rash Neuro:  Alert and Oriented x 3, Strength and sensation are intact, follows commands Psych: euthymic mood, full affect  Wt Readings from Last 3 Encounters:  05/17/17 167 lb (75.8 kg)  04/13/17 169 lb 9.6 oz (76.9 kg)  04/11/17 170 lb (77.1 kg)      Studies/Labs Reviewed:   EKG:  EKG was ordered today and personally reviewed by me and demonstrates NSR 78bpm, nonspecific ST-T changes inferiorly and V3-V6.  Recent Labs: No results found for requested labs within last 8760 hours.   Lipid Panel No results found for: CHOL, TRIG, HDL, CHOLHDL, VLDL, LDLCALC, LDLDIRECT  Additional studies/ records that were reviewed today include: Summarized above    ASSESSMENT & PLAN:   1. Palpitations - becoming more frequent lately, possibly some association with exercise. No history of syncope. Will plan 48-hour monitor to quantify suspected ectopy. Will also plan exercise nuclear stress test to exclude exercise-induced arrhythmias given her description and baseline abnormal EKG. Check screening labs to exclude metabolic contribution. 2. Chest pain - mixed features, mostly atypical, worse with meals. EKG is abnormal at baseline and remains similar to 03/2017. She has minimal risk factors for coronary disease, only h/o intermittent mildly elevated BP. I reviewed EKG and symptoms with Dr. Mayford Knife ([facility]). Will plan exercise nuclear stress test to risk stratify. Warning sx reviewed with patient. 3. GERD - continue Protonix for now. If chest discomfort persists and nuc is normal, would recommend GI f/u. 4. Elevated BP reading without dx of HTN - will follow up BP response to exercise before deciding on  initiation of therapy. Suspect she would benefit from an agent that helps both BP and palpitations such as a beta blocker.  Disposition: F/u with me or Dr. Anne Fu care team APP after above testing.   Medication Adjustments/Labs and Tests Ordered: Current medicines are reviewed at length with the patient today.  Concerns regarding medicines are outlined above. Medication changes, Labs and Tests ordered today are summarized above and listed in the Patient Instructions accessible in Encounters.   Signed, Laurann Montana, PA-C  05/17/2017 11:54 AM    [facility] [address] [place], [place], [place]  [address] Phone: [phone]; Fax: [phone]

## 2017-05-16 NOTE — Telephone Encounter (Signed)
New message    Pt is calling. She scheduled an appt tomorrow at 1130 with Ronie Spies.   Pt c/o of Chest Pain: STAT if CP now or developed within 24 hours  1. Are you having CP right now? Some today on upper left side  2. Are you experiencing any other symptoms (ex. SOB, nausea, vomiting, sweating)? Cool sensation in chest  3. How long have you been experiencing CP? Since last week.  4. Is your CP continuous or coming and going? Coming and going  5. Have you taken Nitroglycerin? No, doesn't have. ?

## 2017-05-16 NOTE — Telephone Encounter (Signed)
Spoke with patient who reports she has been having skipped heart beats frequently.  They are typically more frequently with activity and have caused her not to be able to continue to do the normal activities she is used to.  Sometimes when she has them she develops pain in into her back between her shoulder blades with some shooting pains down her left arm.  Burping helps with the pain but not always.  She also notices the skipping after a meal.  These episodes occurred almost all day on Sunday.  At times she feels lightheaded, feels a hard thump and then has a weird feeling in her chest.  She reports she has been outside trimming rose brushes and has had the skipping feeling the entire time we were on the phone.  She is not having SOB or chest pain at this time.  Reviewed with Dr Anne Fu.  Aware of s/s and that pt has an appt tomorrow here in the office with Ronie Spies, PA.  He would like for pt to come for that appt for further evaluation with EKG and possible heart monitor if nothing abnormal is demonstrated during the EKG.   Left message of recommendations and for pt to call MD on call tonight if further questions/needs.

## 2017-05-17 ENCOUNTER — Ambulatory Visit (INDEPENDENT_AMBULATORY_CARE_PROVIDER_SITE_OTHER): Payer: BLUE CROSS/BLUE SHIELD | Admitting: Physician Assistant

## 2017-05-17 ENCOUNTER — Encounter: Payer: Self-pay | Admitting: Physician Assistant

## 2017-05-17 VITALS — BP 132/88 | HR 78 | Ht 64.0 in | Wt 167.0 lb

## 2017-05-17 DIAGNOSIS — R079 Chest pain, unspecified: Secondary | ICD-10-CM

## 2017-05-17 DIAGNOSIS — K219 Gastro-esophageal reflux disease without esophagitis: Secondary | ICD-10-CM | POA: Diagnosis not present

## 2017-05-17 DIAGNOSIS — R030 Elevated blood-pressure reading, without diagnosis of hypertension: Secondary | ICD-10-CM | POA: Diagnosis not present

## 2017-05-17 DIAGNOSIS — R002 Palpitations: Secondary | ICD-10-CM | POA: Diagnosis not present

## 2017-05-17 NOTE — Patient Instructions (Addendum)
Medication Instructions:  Your physician recommends that you continue on your current medications as directed. Please refer to the Current Medication list given to you today.   Labwork: TODAY:  BMET, CBC, MAGNESIUM, & TSH  Testing/Procedures: Your physician has requested that you have en exercise stress myoview. For further information please visit [email or web address] Please follow instruction sheet, as given.  Your physician has recommended that you wear an event monitor. Event monitors are medical devices that record the heart's electrical activity. Doctors most often [place] these monitors to diagnose arrhythmias. Arrhythmias are problems with the speed or rhythm of the heartbeat. The monitor is a small, portable device. You can wear one while you do your normal daily activities. This is usually used to diagnose what is causing palpitations/syncope (passing out).    Follow-Up: Your physician recommends that you schedule a follow-up appointment in: 1 WEEK AFTER TESTING IS COMPLETE, WITH DR. Anne Fu, DAYNA DUNN, PA-C, OR CARE TEAM APP   Any Other Special Instructions Will Be Listed Below (If Applicable).     If you need a refill on your cardiac medications before your next appointment, please call your pharmacy.

## 2017-05-18 LAB — BASIC METABOLIC PANEL
BUN/Creatinine Ratio: 14 (ref 9–23)
BUN: 11 mg/dL (ref 6–24)
CO2: 22 mmol/L (ref 20–29)
CREATININE: 0.81 mg/dL (ref 0.57–1.00)
Calcium: 9.3 mg/dL (ref 8.7–10.2)
Chloride: 102 mmol/L (ref 96–106)
GFR calc Af Amer: 97 mL/min/{1.73_m2} (ref >59)
GFR, EST NON AFRICAN AMERICAN: 84 mL/min/{1.73_m2} (ref >59)
Glucose: 87 mg/dL (ref 65–99)
Potassium: 4.5 mmol/L (ref 3.5–5.2)
SODIUM: 139 mmol/L (ref 134–144)

## 2017-05-18 LAB — CBC
HEMATOCRIT: 42.7 % (ref 34.0–46.6)
HEMOGLOBIN: 13.8 g/dL (ref 11.1–15.9)
MCH: 27.2 pg (ref 26.6–33.0)
MCHC: 32.3 g/dL (ref 31.5–35.7)
MCV: 84 fL (ref 79–97)
PLATELETS: 284 10*3/uL (ref 150–379)
RBC: 5.08 x10E6/uL (ref 3.77–5.28)
RDW: 14.6 % (ref 12.3–15.4)
WBC: 12.4 10*3/uL — ABNORMAL HIGH (ref 3.4–10.8)

## 2017-05-18 LAB — TSH: TSH: 2.04 u[IU]/mL (ref 0.450–4.500)

## 2017-05-18 LAB — MAGNESIUM: Magnesium: 1.9 mg/dL (ref 1.6–2.3)

## 2017-05-24 ENCOUNTER — Telehealth (HOSPITAL_COMMUNITY): Payer: Self-pay | Admitting: *Deleted

## 2017-05-24 NOTE — Telephone Encounter (Signed)
Left message on voicemail per DPR in reference to upcoming appointment scheduled on 05/27/17 at 0915 with detailed instructions given per Myocardial Perfusion Study Information Sheet for the test. LM to arrive 15 minutes early, and that it is imperative to arrive on time for appointment to keep from having the test rescheduled. If you need to cancel or reschedule your appointment, please call the office within 24 hours of your appointment. Failure to do so may result in a cancellation of your appointment, and a $50 no show fee. Phone number given for call back for any questions.

## 2017-05-27 ENCOUNTER — Ambulatory Visit (INDEPENDENT_AMBULATORY_CARE_PROVIDER_SITE_OTHER): Payer: BLUE CROSS/BLUE SHIELD

## 2017-05-27 ENCOUNTER — Ambulatory Visit (HOSPITAL_COMMUNITY): Payer: BLUE CROSS/BLUE SHIELD | Attending: Cardiology

## 2017-05-27 DIAGNOSIS — R079 Chest pain, unspecified: Secondary | ICD-10-CM | POA: Diagnosis not present

## 2017-05-27 DIAGNOSIS — R002 Palpitations: Secondary | ICD-10-CM | POA: Diagnosis not present

## 2017-05-27 LAB — MYOCARDIAL PERFUSION IMAGING
CHL CUP MPHR: 169 {beats}/min
CHL CUP NUCLEAR SDS: 0
CHL CUP NUCLEAR SSS: 0
CSEPEW: 8.5 METS
CSEPPHR: 157 {beats}/min
Exercise duration (min): 7 min
Exercise duration (sec): 0 s
LHR: 0.24
LV dias vol: 66 mL (ref 46–106)
LV sys vol: 15 mL
NUC STRESS TID: 1.1
Percent HR: 92 %
Rest HR: 74 {beats}/min
SRS: 0

## 2017-05-27 MED ORDER — TECHNETIUM TC 99M TETROFOSMIN IV KIT
9.2000 | PACK | Freq: Once | INTRAVENOUS | Status: AC | PRN
  Administered 2017-05-27: 9.2 via INTRAVENOUS
  Filled 2017-05-27: qty 10

## 2017-05-27 MED ORDER — TECHNETIUM TC 99M TETROFOSMIN IV KIT
31.5000 | PACK | Freq: Once | INTRAVENOUS | Status: AC | PRN
  Administered 2017-05-27: 31.5 via INTRAVENOUS
  Filled 2017-05-27: qty 32

## 2017-05-31 ENCOUNTER — Telehealth: Payer: Self-pay | Admitting: *Deleted

## 2017-05-31 MED ORDER — METOPROLOL TARTRATE 25 MG PO TABS: 25.0000 mg | ORAL_TABLET | Freq: Two times a day (BID) | ORAL | 0 refills | Status: DC

## 2017-05-31 NOTE — Telephone Encounter (Signed)
-----   Message from Laurann Montanaayna N Dunn, [place]-C sent at 05/27/2017  1:53 PM EDT ----- Please let patient know stress test was normal. BP was high prior to exercise. Given her h/o palpitations let's start metoprolol tartrate 25mg  BID. Keep plan for monitor and followup. Ask patient to periodically follow BP at home and call if running >140 systolic or any new low readings. Bringing in a log of outpatient BPs to appt would be helpful. Dayna Dunn PA-C

## 2017-06-08 ENCOUNTER — Encounter: Payer: Self-pay | Admitting: Physician Assistant

## 2017-06-08 NOTE — Progress Notes (Signed)
Cardiology Office Note    Date:  06/09/2017  ID:  Kelsey Torres, DOB [birth year], MRN [id number] PCP:  Vivien Presto, MD  Cardiologist: Dr. Anne Fu   Chief Complaint: f/u testing  History of Present Illness:  Kelsey Torres is a 51 y.o. female with history of heartburn, palpitations, fatigue, tinnitus who presents for evaluation of chest pain. She has prior history of heart palpitations. Dr. Anne Fu reports prior workup with heart monitor 15 years ago with irregular heartbeat, unclear further details but no med changes were made. Per his last OV in 03/2017, she was reporting headache & ringing in her ears since a [employer] meter was placed on her house. At that visit she was reporting a sensation of episodic thump/flip felt c/w PACs, PVCs. Conserative management recommended, with recommendation to consider repeat monitoring for accelerating symptoms. 2D Echo 03/2017 showed EF 60-65%, normal diastolic function. She was seen back in clinic 05/17/17 reporting intermittent palpitations as well as rare sensation of focal chest discomfort in the left side of her chest described as a "cold sensation." TSH, Mg, BMET wnl, WBC 12.4 (advised to see PCP, whom she has since seen for vaginal discharge and viral gastroenteritis). She underwent ETT-nuc 05/2017 which was normal, EF 77%. 48-hour Holter monitor showed 115 PVCs, 38 PACs, fluttering correlating with PVCs, 4 beat run and 3 beat run of slow idioventricular rhythm, no adverse rhythms. Dr. Anne Fu felt this was reassuring. She also has had intermittently elevated BP at times including resting pressure of 160/97 the day of her stress test.  After above studies were performed, metoprolol was prescribed but she did not start this as she had been checking her BP at home with a large cuff and was getting normal readings. Her arm size is appropriate for a small cuff, so may be falsely low. She reports no further chest pain. She is still aware of her PVCs. BP today  is 156/92 with recheck 150/90 by me. She seems hesitant to start medication due to h/o sensitivity to meds. She wonders if her palpitations are hormonally related to menopause. She also reports increased anxiety at times.   Past Medical History:  Diagnosis Date  . Abnormal EKG    a. ETT-nuc 05/2017 - normal.  . Fatigue   . Headache    a. pt reports prior sensitivity to radiofrequencies from [employer] meter in home.  Marland Kitchen Heartburn   . Idioventricular rhythm (HCC)    a. Event monitor 05/2017:  48-hour Holter monitor showed 115 PVCs, 38 PACs, fluttering correlating with PVCs, 4 beat run and 3 beat run of slow idioventricular rhythm.  . Palpitations   . Premature atrial contractions   . PVC's (premature ventricular contractions)   . Tinnitus   . UTI (lower urinary tract infection)     Past Surgical History:  Procedure Laterality Date  . ABLATION    . CHOLECYSTECTOMY    . DILATION AND CURETTAGE OF UTERUS      Current Medications: Current Meds  Medication Sig  . loratadine (CLARITIN) 10 MG tablet Take 10 mg by mouth daily as needed for allergies.  . mometasone (NASONEX) 50 MCG/ACT nasal spray Place 2 sprays into the nose daily as needed.   . Pantoprazole Sodium (PROTONIX PO) Take 40 mg by mouth daily.      Allergies:   Clindamycin/lincomycin; Penicillins; and Sulfa antibiotics   Social History   Social History  . Marital status: Married    Spouse name: N/A  . Number  of children: N/A  . Years of education: N/A   Social History Main Topics  . Smoking status: Never Smoker  . Smokeless tobacco: Never Used  . Alcohol use No  . Drug use: No  . Sexual activity: Not Asked   Other Topics Concern  . None   Social History Narrative  . None     Family History:  Family History  Problem Relation Age of Onset  . Healthy Mother   . Healthy Father   . Cancer - Ovarian Paternal Grandmother     ROS:   Please see the history of present illness. All other systems are reviewed  and otherwise negative.    PHYSICAL EXAM:   VS:  BP (!) 156/92   Pulse 68   Ht  (1.626 m)   Wt 166 lb (75.3 kg)   SpO2 98%   BMI 28.49 kg/m   BMI: Body mass index is 28.49 kg/m. GEN: Well nourished, well developed WF, in no acute distress  HEENT: normocephalic, atraumatic Neck: no JVD, carotid bruits, or masses Cardiac: RRR; no murmurs, rubs, or gallops, no edema  Respiratory:  clear to auscultation bilaterally, normal work of breathing GI: soft, nontender, nondistended, + BS MS: no deformity or atrophy  Skin: warm and dry, no rash Neuro:  Alert and Oriented x 3, Strength and sensation are intact, follows commands Psych: euthymic mood, full affect  Wt Readings from Last 3 Encounters:  06/09/17 166 lb (75.3 kg)  05/17/17 167 lb (75.8 kg)  04/13/17 169 lb 9.6 oz (76.9 kg)      Studies/Labs Reviewed:   EKG: EKG was not ordered today  Recent Labs: 05/17/2017: BUN 11; Creatinine, Ser 0.81; Hemoglobin 13.8; Magnesium 1.9; Platelets 284; Potassium 4.5; Sodium 139; TSH 2.040   Lipid Panel No results found for: CHOL, TRIG, HDL, CHOLHDL, VLDL, LDLCALC, LDLDIRECT  Additional studies/ records that were reviewed today include: Summarized above.    ASSESSMENT & PLAN:   1. Palpitations c/w PVCs, PACs - patient does not wish to start metoprolol due to risk of fatigue. I think she would be a good candidate for propranolol given her concomitant issues with anxiety. Will start extended release  daily. I asked her to follow symptoms and call us in 1 week with update on palpitations. Structural eval was unrevealing. 2. Chest pain - resolved. Etiology of EKG changes unclear, question related to elevated BP. Nuclear stress test reassuring. D/w Dr. Anne Fu. If she had recurrence, coronary CTA could be considered for definitive eval. 3. GERD - continue PPI. 4. Essential HTN - follow BP with addition of propranolol. Asked her to monitor at home with appropriate cuff size (small) and  call either [place] or primary care if she is noticing her BP running >130/80. I do not necessarily think she needs to pay a specialist copay to follow her BP so I have advised her to f/u with primary care within 1-2 weeks to recheck her blood pressure. Also discussed sodium restriction.  Disposition: F/u with Dr. Anne Fu in 1 year.  Medication Adjustments/Labs and Tests Ordered: Current medicines are reviewed at length with the patient today.  Concerns regarding medicines are outlined above. Medication changes, Labs and Tests ordered today are summarized above and listed in the Patient Instructions accessible in Encounters.   Signed, Laurann Montana, PA-C  06/09/2017 10:27 AM    [facility] [address] [place], [place], [place]  [address] Phone: [phone]; Fax: [phone]

## 2017-06-09 ENCOUNTER — Encounter (INDEPENDENT_AMBULATORY_CARE_PROVIDER_SITE_OTHER): Payer: Self-pay

## 2017-06-09 ENCOUNTER — Encounter: Payer: Self-pay | Admitting: Physician Assistant

## 2017-06-09 ENCOUNTER — Ambulatory Visit (INDEPENDENT_AMBULATORY_CARE_PROVIDER_SITE_OTHER): Payer: BLUE CROSS/BLUE SHIELD | Admitting: Physician Assistant

## 2017-06-09 VITALS — BP 156/92 | HR 68 | Ht 64.0 in | Wt 166.0 lb

## 2017-06-09 DIAGNOSIS — R002 Palpitations: Secondary | ICD-10-CM

## 2017-06-09 DIAGNOSIS — I10 Essential (primary) hypertension: Secondary | ICD-10-CM | POA: Diagnosis not present

## 2017-06-09 DIAGNOSIS — K219 Gastro-esophageal reflux disease without esophagitis: Secondary | ICD-10-CM | POA: Diagnosis not present

## 2017-06-09 DIAGNOSIS — R9431 Abnormal electrocardiogram [ECG] [EKG]: Secondary | ICD-10-CM | POA: Diagnosis not present

## 2017-06-09 DIAGNOSIS — R079 Chest pain, unspecified: Secondary | ICD-10-CM

## 2017-06-09 MED ORDER — PROPRANOLOL HCL ER 60 MG PO CP24: 60.0000 mg | ORAL_CAPSULE | Freq: Every day | ORAL | 1 refills | Status: DC

## 2017-06-09 NOTE — Patient Instructions (Addendum)
Medication Instructions:  Your physician has recommended you make the following change in your medication:  1.  STOP the Metoprolol 2.  START Propranolol 60 mg daily   Labwork: None ordered  Testing/Procedures: None ordered  Follow-Up: Your physician wants you to follow-up in: 1 YEAR WITH DR. Anne Fu  You will receive a reminder letter in the mail two months in advance. If you don't receive a letter, please call our office to schedule the follow-up appointment.    Any Other Special Instructions Will Be Listed Below (If Applicable). Monitor your blood pressure.  Please call the office if it is running higher than 130/80  Follow up with your Primary Care Physician within 1-2 weeks for a blood pressure recheck.   If you need a refill on your cardiac medications before your next appointment, please call your pharmacy.

## 2017-06-10 ENCOUNTER — Telehealth: Payer: Self-pay | Admitting: Cardiology

## 2017-06-10 NOTE — Telephone Encounter (Signed)
New Message  Pt call requesting to speak with RN about getting a prescription for a bp machine. Pt states her insurance company will cover machine from a medical supply store. Please call back to discuss

## 2017-06-10 NOTE — Telephone Encounter (Signed)
Pt requesting a written RX for a BP monitor so that insurance company will cover the cost of it.  Aware Dr Anne Fu is not in the office but I will call her back next week once I have a signed RX.

## 2017-06-15 NOTE — Telephone Encounter (Signed)
Left message for patient on mobile phone VM - RX for blood pressure monitor is at the front desk ready to be picked up.  Requested she call back if questions or concerns.

## 2018-01-09 ENCOUNTER — Other Ambulatory Visit: Payer: Self-pay | Admitting: Obstetrics and Gynecology

## 2018-01-09 DIAGNOSIS — Z1231 Encounter for screening mammogram for malignant neoplasm of breast: Secondary | ICD-10-CM

## 2018-01-31 ENCOUNTER — Ambulatory Visit
Admission: RE | Admit: 2018-01-31 | Discharge: 2018-01-31 | Disposition: A | Discharge status: Home / Self Care | Payer: BLUE CROSS/BLUE SHIELD | Source: Ambulatory Visit | Attending: Obstetrics and Gynecology | Admitting: Obstetrics and Gynecology

## 2018-01-31 DIAGNOSIS — Z1231 Encounter for screening mammogram for malignant neoplasm of breast: Secondary | ICD-10-CM

## 2018-06-19 ENCOUNTER — Encounter: Payer: Self-pay | Admitting: Cardiology

## 2018-06-19 ENCOUNTER — Encounter (INDEPENDENT_AMBULATORY_CARE_PROVIDER_SITE_OTHER): Payer: Self-pay

## 2018-06-19 ENCOUNTER — Ambulatory Visit (INDEPENDENT_AMBULATORY_CARE_PROVIDER_SITE_OTHER): Payer: BLUE CROSS/BLUE SHIELD | Admitting: Cardiology

## 2018-06-19 VITALS — BP 118/82 | HR 76 | Ht 64.0 in | Wt 171.6 lb

## 2018-06-19 DIAGNOSIS — K219 Gastro-esophageal reflux disease without esophagitis: Secondary | ICD-10-CM | POA: Diagnosis not present

## 2018-06-19 DIAGNOSIS — R002 Palpitations: Secondary | ICD-10-CM

## 2018-06-19 DIAGNOSIS — R079 Chest pain, unspecified: Secondary | ICD-10-CM

## 2018-06-19 NOTE — Progress Notes (Signed)
Cardiology Office Note:    Date:  06/19/2018   ID:  Kelsey Torres, DOB Jan 23, 1966, MRN [id number]  PCP:  Vivien Presto, MD  Cardiologist:  No primary care provider on file.  Electrophysiologist:  None   Referring MD: Corrington, Meredith Mody, MD     History of Present Illness:    Kelsey Torres is a 52 y.o. female here for follow-up of palpitations, PVCs and PACs noted on monitoring.  She was seen previously on 06/09/2017 by Annabelle Harman done.  Previously a 4 beat and a 3 beat run of a slow idioventricular rhythm but no sustained ventricular tachycardia.  Reassurance was given.  Metoprolol was prescribed but she did not start this previously.  Aware of her PVCs.  Blood pressure also had been in the 150s 160s.  Prior echocardiogram 03/2017 showed EF 60%, normal.  Last Holter was also 05/2017 as well as nuclear stress test 05/2017 which was normal.  She did not wish to start metoprolol because of risk of fatigue.  Annabelle Harman thought she would be a good candidate for propranolol given her concomitant issues with anxiety and she gave her extended release 60 mg daily. She did not take.   BP has been up and down. No major palpitations. Still the same.   Gerd-took protonix for years off and on. When she goes on it, heart will race. Feels more. When off subsides after one week.   Overall no recurrent chest pain fevers chills nausea vomiting syncope bleeding.   Past Medical History:  Diagnosis Date  . Abnormal EKG    a. ETT-nuc 05/2017 - normal.  . Fatigue   . Headache    a. pt reports prior sensitivity to radiofrequencies from [employer] meter in home.  Marland Kitchen Heartburn   . Idioventricular rhythm (HCC)    a. Event monitor 05/2017:  48-hour Holter monitor showed 115 PVCs, 38 PACs, fluttering correlating with PVCs, 4 beat run and 3 beat run of slow idioventricular rhythm.  . Palpitations   . Premature atrial contractions   . PVC's (premature ventricular contractions)   . Tinnitus   . UTI (lower urinary  tract infection)     Past Surgical History:  Procedure Laterality Date  . ABLATION    . CHOLECYSTECTOMY    . DILATION AND CURETTAGE OF UTERUS      Current Medications: Current Meds  Medication Sig  . conjugated estrogens (PREMARIN) vaginal cream Place 1 Applicatorful vaginally 3 (three) times a week.   . loratadine (CLARITIN) 10 MG tablet Take 10 mg by mouth daily as needed for allergies.  . mometasone (NASONEX) 50 MCG/ACT nasal spray Place 2 sprays into the nose daily as needed.   . pantoprazole (PROTONIX) 40 MG tablet Take 1 tablet by mouth daily.     Allergies:   Clindamycin/lincomycin; Penicillins; and Sulfa antibiotics   Social History   Socioeconomic History  . Marital status: Married    Spouse name: Not on file  . Number of children: Not on file  . Years of education: Not on file  . Highest education level: Not on file  Occupational History  . Not on file  Social Needs  . Financial resource strain: Not on file  . Food insecurity:    Worry: Not on file    Inability: Not on file  . Transportation needs:    Medical: Not on file    Non-medical: Not on file  Tobacco Use  . Smoking status: Never Smoker  . Smokeless tobacco: Never  Used  Substance and Sexual Activity  . Alcohol use: No  . Drug use: No  . Sexual activity: Not on file  Lifestyle  . Physical activity:    Days per week: Not on file    Minutes per session: Not on file  . Stress: Not on file  Relationships  . Social connections:    Talks on phone: Not on file    Gets together: Not on file    Attends religious service: Not on file    Active member of club or organization: Not on file    Attends meetings of clubs or organizations: Not on file    Relationship status: Not on file  Other Topics Concern  . Not on file  Social History Narrative  . Not on file     Family History: The patient's family history includes Breast cancer in her maternal aunt; Cancer - Ovarian in her paternal grandmother;  Healthy in her father and mother.  ROS:   Please see the history of present illness.     All other systems reviewed and are negative.  EKGs/Labs/Other Studies Reviewed:    The following studies were reviewed today: Prior stress test echocardiogram EKGs reviewed  EKG:  EKG is  ordered today.  The ekg ordered today demonstrates 06/19/2018-sinus rhythm 80 nonspecific ST-T wave changes, unchanged.  Personally reviewed and interpreted.  Recent Labs: No results found for requested labs within last 8760 hours.  Recent Lipid Panel No results found for: CHOL, TRIG, HDL, CHOLHDL, VLDL, LDLCALC, LDLDIRECT  Physical Exam:    VS:  BP 118/82   Pulse 76   Ht 5\' 4"  (1.626 m)   Wt 171 lb 9.6 oz (77.8 kg)   BMI 29.46 kg/m     Wt Readings from Last 3 Encounters:  06/19/18 171 lb 9.6 oz (77.8 kg)  06/09/17 166 lb (75.3 kg)  05/17/17 167 lb (75.8 kg)     GEN:  Well nourished, well developed in no acute distress HEENT: Normal NECK: No JVD; No carotid bruits LYMPHATICS: No lymphadenopathy CARDIAC: RRR, no murmurs, rubs, gallops RESPIRATORY:  Clear to auscultation without rales, wheezing or rhonchi  ABDOMEN: Soft, non-tender, non-distended MUSCULOSKELETAL:  No edema; No deformity  SKIN: Warm and dry NEUROLOGIC:  Alert and oriented x 3 PSYCHIATRIC:  Normal affect   ASSESSMENT:    1. Palpitations   2. Chest pain, unspecified type   3. Gastroesophageal reflux disease, esophagitis presence not specified    PLAN:    In order of problems listed above:  PVC/PAC/palpitations - Propranolol extended release 60 mg once a day was prescribed previously.  She did not take this.  This is fine.  She sometimes feels her palpitations.  Reassurance has been given. Holter monitor reviewed.  No significant adverse arrhythmias.  Chest pain -No current symptoms. - Previous Nuclear stress test reassuring. -If significant symptoms were to once again occur, could consider a coronary CTA for definitive  evaluation.  GERD -Continue with PPI on as-needed basis.  She feels as though her palpitations sometimes increased when she takes her Protonix.  Essential hypertension - Sometimes up sometimes down.  Currently excellent today.  Continue to monitor.  She is interested in having vein stripping procedure for varicose veins.  This is fine with me.  We also gave her a prescription for a home blood pressure her blood pressure cuff.   She can come back and see [place] on as-needed basis.  Medication Adjustments/Labs and Tests Ordered: Current medicines are reviewed at  length with the patient today.  Concerns regarding medicines are outlined above.  Orders Placed This Encounter  Procedures  . EKG 12-Lead   No orders of the defined types were placed in this encounter.   Patient Instructions  Medication Instructions:  The current medical regimen is effective;  continue present plan and medications.  Follow-Up: Follow up as needed with Dr Anne Fu.  Thank you for choosing [facility]!!        Signed, Donato Schultz, MD  06/19/2018 9:52 AM    [facility]

## 2018-06-19 NOTE — Patient Instructions (Signed)
Medication Instructions:  The current medical regimen is effective;  continue present plan and medications.  Follow-Up: Follow up as needed with Dr Skains.  Thank you for choosing [facility]!!     

## 2019-10-01 ENCOUNTER — Telehealth: Payer: Self-pay | Admitting: Cardiology

## 2019-10-01 NOTE — Telephone Encounter (Signed)
I spoke to the patient and said that Dr Anne Fu was ok with keeping her appointment this Friday 1/15.  I informed her that if symptoms worsen, she should go to the ED for further evaluation.  She verbalized understanding.

## 2019-10-01 NOTE — Telephone Encounter (Signed)
Called patient back about message. Patient's BP is elevated at times. Patient complained of dizziness and blurred vision which comes and goes since patient was last seen. Patient stated the only new symptom she has is fluttering in her chest at times. Patient has appointment on Friday with Dr. Anne Fu. Will see if patient needs to be seen sooner or keep her appointment as scheduled.

## 2019-10-01 NOTE — Telephone Encounter (Signed)
Will see on Friday Donato Schultz, MD

## 2019-10-01 NOTE — Telephone Encounter (Signed)
Pt c/o BP issue: STAT if pt c/o blurred vision, one-sided weakness or slurred speech  1. What are your last 5 BP readings?  Today 158/90 and the last few days it have been up  As high asto 146/95, 146/88,141/19, 131/82 drop to 120/79  2. Are you having any other symptoms (ex. Dizziness, headache, blurred vision, passed out)?  Some dizziness and blurred vision and fluttering in chest and had a sharp pin in chest, but burped after it  3. What is your BP issue? Blood pressure running a lot and sometimes it drops- pt wanted to be seen, I amde her appt with Dr Anne Fu for Friday(10-04-18)

## 2019-10-05 ENCOUNTER — Ambulatory Visit (INDEPENDENT_AMBULATORY_CARE_PROVIDER_SITE_OTHER): Payer: BC Managed Care – PPO | Admitting: Cardiology

## 2019-10-05 ENCOUNTER — Encounter: Payer: Self-pay | Admitting: Cardiology

## 2019-10-05 ENCOUNTER — Other Ambulatory Visit: Payer: Self-pay

## 2019-10-05 VITALS — BP 150/92 | HR 77 | Ht 64.0 in | Wt 176.0 lb

## 2019-10-05 DIAGNOSIS — R002 Palpitations: Secondary | ICD-10-CM

## 2019-10-05 DIAGNOSIS — Z79899 Other long term (current) drug therapy: Secondary | ICD-10-CM | POA: Diagnosis not present

## 2019-10-05 DIAGNOSIS — I10 Essential (primary) hypertension: Secondary | ICD-10-CM

## 2019-10-05 MED ORDER — LOSARTAN POTASSIUM 25 MG PO TABS: 25.0000 mg | ORAL_TABLET | Freq: Every day | ORAL | 3 refills | Status: DC

## 2019-10-05 NOTE — Patient Instructions (Signed)
Medication Instructions:  Please start Losartan 25 mg a day.  Continue all other medications as listed.  *If you need a refill on your cardiac medications before your next appointment, please call your pharmacy*  Lab Work: Please have blood work in 1 month (BMP) If you have labs (blood work) drawn today and your tests are completely normal, you will receive your results only by: Marland Kitchen MyChart Message (if you have MyChart) OR . A paper copy in the mail If you have any lab test that is abnormal or we need to change your treatment, we will call you to review the results.  Follow-Up: At [facility], you and your health needs are our priority.  As part of our continuing mission to provide you with exceptional heart care, we have created designated Provider Care Teams.  These Care Teams include your primary Cardiologist (physician) and Advanced Practice Providers (APPs -  Physician Assistants and Nurse Practitioners) who all work together to provide you with the care you need, when you need it.  Your next appointment:   2 month(s)  The format for your next appointment:   In Person  Provider:   Georgie Chard, NP   Thank you for choosing [facility]!!

## 2019-10-05 NOTE — Progress Notes (Signed)
Cardiology Office Note:    Date:  10/05/2019   ID:  Kelsey Torres, DOB 06/23/1966, MRN [id number]  PCP:  Kelsey Rim, MD  Cardiologist:  No primary care provider on file.  Electrophysiologist:  None   Referring MD: Corrington, Delsa Grana, MD     History of Present Illness:    Kelsey Torres is a 54 y.o. female here for follow-up of palpitations, PVCs and PACs noted on monitoring.  She was seen previously on 06/09/2017 by Kelsey Torres done.  Previously a 4 beat and a 3 beat run of a slow idioventricular rhythm but no sustained ventricular tachycardia.  Reassurance was given.  Metoprolol was prescribed but she did not start this previously.  Aware of her PVCs.  Blood pressure also had been in the 150s 160s.  Prior echocardiogram 03/2017 showed EF 60%, normal.  Last Holter was also 05/2017 as well as nuclear stress test 05/2017 which was normal.  She did not wish to start metoprolol because of risk of fatigue.  Kelsey Torres thought she would be a good candidate for propranolol given her concomitant issues with anxiety and she gave her extended release 60 mg daily. She did not take.   BP has been up and down. No major palpitations. Still the same.   Gerd-took protonix for years off and on. When she goes on it, heart will race. Feels more. When off subsides after one week.   10/05/2019 -She is here for the follow-up of blood pressures.  Her readings have ranged from 119-153/74-90.  Most of the readings are in the 1 50-2 30 range systolic.  77A diastolic.  She is not having any symptoms.  She will occasionally feel palpitations or PVCs.  Her father has this as well.  Mother has HTN Father ETOH, Carotid issues. Hard to control BP.      Past Medical History:  Diagnosis Date  . Abnormal EKG    a. ETT-nuc 05/2017 - normal.  . Fatigue   . Headache    a. pt reports prior sensitivity to radiofrequencies from Kelsey Torres power meter in home.  Marland Kitchen Heartburn   . Idioventricular rhythm ([place])    a. Event monitor  05/2017:  48-hour Holter monitor showed 115 PVCs, 38 PACs, fluttering correlating with PVCs, 4 beat run and 3 beat run of slow idioventricular rhythm.  . Palpitations   . Premature atrial contractions   . PVC's (premature ventricular contractions)   . Tinnitus   . UTI (lower urinary tract infection)     Past Surgical History:  Procedure Laterality Date  . ABLATION    . CHOLECYSTECTOMY    . DILATION AND CURETTAGE OF UTERUS      Current Medications: Current Meds  Medication Sig  . loratadine (CLARITIN) 10 MG tablet Take 10 mg by mouth daily as needed for allergies.  . mometasone (NASONEX) 50 MCG/ACT nasal spray Place 2 sprays into the nose daily as needed.   . pantoprazole (PROTONIX) 40 MG tablet Take 1 tablet by mouth daily.     Allergies:   Clindamycin/lincomycin, Penicillins, and Sulfa antibiotics   Social History   Socioeconomic History  . Marital status: Married    Spouse name: Not on file  . Number of children: Not on file  . Years of education: Not on file  . Highest education level: Not on file  Occupational History  . Not on file  Tobacco Use  . Smoking status: Never Smoker  . Smokeless tobacco: Never Used  Substance and Sexual  Activity  . Alcohol use: No  . Drug use: No  . Sexual activity: Not on file  Other Topics Concern  . Not on file  Social History Narrative  . Not on file   Social Determinants of Health   Financial Resource Strain:   . Difficulty of Paying Living Expenses: Not on file  Food Insecurity:   . Worried About Programme researcher, broadcasting/film/video in the Last Year: Not on file  . Ran Out of Food in the Last Year: Not on file  Transportation Needs:   . Lack of Transportation (Medical): Not on file  . Lack of Transportation (Non-Medical): Not on file  Physical Activity:   . Days of Exercise per Week: Not on file  . Minutes of Exercise per Session: Not on file  Stress:   . Feeling of Stress : Not on file  Social Connections:   . Frequency of  Communication with Friends and Family: Not on file  . Frequency of Social Gatherings with Friends and Family: Not on file  . Attends Religious Services: Not on file  . Active Member of Clubs or Organizations: Not on file  . Attends Banker Meetings: Not on file  . Marital Status: Not on file     Family History: The patient's family history includes Breast cancer in her maternal aunt; Cancer - Ovarian in her paternal grandmother; Healthy in her father and mother.  ROS:   Please see the history of present illness.     All other systems reviewed and are negative.  EKGs/Labs/Other Studies Reviewed:    The following studies were reviewed today: Prior stress test echocardiogram EKGs reviewed  EKG:  EKG is  ordered today.  The ekg ordered today demonstrates 06/19/2018-sinus rhythm 80 nonspecific ST-T wave changes, unchanged.  Personally reviewed and interpreted.  Recent Labs: No results found for requested labs within last 8760 hours.  Recent Lipid Panel No results found for: CHOL, TRIG, HDL, CHOLHDL, VLDL, LDLCALC, LDLDIRECT  Physical Exam:    VS:  BP (!) 150/92   Pulse 77   Ht 5\' 4"  (1.626 m)   Wt 176 lb (79.8 kg)   SpO2 98%   BMI 30.21 kg/m     Wt Readings from Last 3 Encounters:  10/05/19 176 lb (79.8 kg)  06/19/18 171 lb 9.6 oz (77.8 kg)  06/09/17 166 lb (75.3 kg)     GEN: Well nourished, well developed, in no acute distress  HEENT: normal  Neck: no JVD, carotid bruits, or masses Cardiac: RRR; no murmurs, rubs, or gallops,no edema  Respiratory:  clear to auscultation bilaterally, normal work of breathing GI: soft, nontender, nondistended, + BS MS: no deformity or atrophy  Skin: warm and dry, no rash Neuro:  Alert and Oriented x 3, Strength and sensation are intact Psych: euthymic mood, full affect   ASSESSMENT:    1. Essential hypertension   2. Medication management   3. Palpitations    PLAN:    In order of problems listed above:   Essential  hypertension -Somewhat labile.  Many blood pressures as above recorded.  I would like for her to try low-dose losartan 25 mg once a day.  Some reluctance he to using medications understandably.  Discussed that this is a well-tolerated medicine.  In 1 month we will check a basic metabolic profile and in 2 months we will have her come back in and see APP for follow-up.  Prior creatinine was 0.8 from outside labs.  Discussed with  her potential risks of hypertension such as heart attack and stroke.   PVC/PAC/palpitations -Overall will feel these occasionally.  Reassurance.  Previously had discussed potentially propranolol but she did not wish to take.  Chest pain -No current symptoms.  Doing well. - Previous Nuclear stress test reassuring. -If significant symptoms were to once again occur, could consider a coronary CTA for definitive evaluation.  GERD -Continue with PPI on as-needed basis.  She feels as though her palpitations sometimes increased when she takes her Protonix.  New issue today.    She is interested in having vein stripping procedure for varicose veins.  This is fine with me.  We also gave her a prescription for a home blood pressure her blood pressure cuff.   She can come back and see [place] on as-needed basis.  Medication Adjustments/Labs and Tests Ordered: Current medicines are reviewed at length with the patient today.  Concerns regarding medicines are outlined above.  Orders Placed This Encounter  Procedures  . Basic Metabolic Panel (BMET)  . EKG 12-Lead cs   Meds ordered this encounter  Medications  . losartan (COZAAR) 25 MG tablet    Sig: Take 1 tablet (25 mg total) by mouth daily.    Dispense:  90 tablet    Refill:  3    Patient Instructions  Medication Instructions:  Please start Losartan 25 mg a day.  Continue all other medications as listed.  *If you need a refill on your cardiac medications before your next appointment, please call your pharmacy*  Lab  Work: Please have blood work in 1 month (BMP) If you have labs (blood work) drawn today and your tests are completely normal, you will receive your results only by: Marland Kitchen MyChart Message (if you have MyChart) OR . A paper copy in the mail If you have any lab test that is abnormal or we need to change your treatment, we will call you to review the results.  Follow-Up: At [facility], you and your health needs are our priority.  As part of our continuing mission to provide you with exceptional heart care, we have created designated Provider Care Teams.  These Care Teams include your primary Cardiologist (physician) and Advanced Practice Providers (APPs -  Physician Assistants and Nurse Practitioners) who all work together to provide you with the care you need, when you need it.  Your next appointment:   2 month(s)  The format for your next appointment:   In Person  Provider:   Georgie Chard, NP   Thank you for choosing [facility]!!        Signed, Donato Schultz, MD  10/05/2019 12:34 PM    [facility]

## 2019-11-06 ENCOUNTER — Other Ambulatory Visit: Payer: BC Managed Care – PPO

## 2019-12-05 ENCOUNTER — Ambulatory Visit: Payer: BC Managed Care – PPO | Admitting: Cardiology

## 2020-01-25 DIAGNOSIS — E66811 Obesity, class 1: Secondary | ICD-10-CM | POA: Insufficient documentation

## 2020-07-24 ENCOUNTER — Telehealth: Payer: Self-pay | Admitting: Cardiology

## 2020-07-24 NOTE — Telephone Encounter (Signed)
New Message:   Pt would like for Dr Judd Gaudier nurse to call her. She thinks she might need to have lab work. She will tell her what her and Dr Caro Hight had discussed.

## 2020-07-24 NOTE — Telephone Encounter (Signed)
Spoke with patient who saw Dr Anne Fu 10/05/2019 and was to have start Losartan 25 mg daily, f/u with BMP in 1 month and APP in 2 months.  She did not do this.  She did decide 4 days ago her BP was too high and she started the Losartan rx that was given to her in January.  She is calling asking about the need for lab work.  Advised since it has been so long since she has been seen here, it would be best to schedule a f/u with Dr Anne Fu.  He will then decide when she will need f/u blood work.  Advised to keep a BP diary until the time of her appt which was schedule for Tuesday 07/29/2020. She states understanding.  Dr Anne Fu is aware.

## 2020-07-29 ENCOUNTER — Ambulatory Visit (INDEPENDENT_AMBULATORY_CARE_PROVIDER_SITE_OTHER): Payer: BC Managed Care – PPO | Admitting: Cardiology

## 2020-07-29 ENCOUNTER — Other Ambulatory Visit: Payer: Self-pay

## 2020-07-29 ENCOUNTER — Encounter: Payer: Self-pay | Admitting: Cardiology

## 2020-07-29 VITALS — BP 150/90 | HR 95 | Ht 64.0 in | Wt 179.8 lb

## 2020-07-29 DIAGNOSIS — Z79899 Other long term (current) drug therapy: Secondary | ICD-10-CM | POA: Diagnosis not present

## 2020-07-29 DIAGNOSIS — I10 Essential (primary) hypertension: Secondary | ICD-10-CM | POA: Diagnosis not present

## 2020-07-29 DIAGNOSIS — R002 Palpitations: Secondary | ICD-10-CM

## 2020-07-29 LAB — BASIC METABOLIC PANEL
BUN/Creatinine Ratio: 13 (ref 9–23)
BUN: 11 mg/dL (ref 6–24)
CO2: 23 mmol/L (ref 20–29)
Calcium: 9.3 mg/dL (ref 8.7–10.2)
Chloride: 102 mmol/L (ref 96–106)
Creatinine, Ser: 0.82 mg/dL (ref 0.57–1.00)
GFR calc Af Amer: 94 mL/min/{1.73_m2} (ref >59)
GFR calc non Af Amer: 81 mL/min/{1.73_m2} (ref >59)
Glucose: 95 mg/dL (ref 65–99)
Potassium: 4.1 mmol/L (ref 3.5–5.2)
Sodium: 139 mmol/L (ref 134–144)

## 2020-07-29 NOTE — Progress Notes (Signed)
Cardiology Office Note:    Date:  08/02/2020   ID:  Kelsey Torres, DOB Jun 09, 1966, MRN [id number]  PCP:  Vivien Presto, MD  [facility] Cardiologist:  No primary care provider on file.  [facility] Electrophysiologist:  None   Referring MD: Corrington, Meredith Mody, MD     History of Present Illness:    Kelsey Torres is a 54 y.o. female here for follow-up, hypertension.  Back in 10/05/2019 was to start losartan 25.  She did not do this at the time.  She has a diary of her blood pressures after starting the losartan and that they look wonderful.  Excellent results.  Also had palpitations, PVCs and PACs noted on monitor.  Normal EF on echo.  2018 nuclear stress test was normal.  Previously did not want to start metoprolol because of risk of fatigue.    Past Medical History:  Diagnosis Date  . Abnormal EKG    a. ETT-nuc 05/2017 - normal.  . Fatigue   . Headache    a. pt reports prior sensitivity to radiofrequencies from [employer] power meter in home.  Marland Kitchen Heartburn   . Idioventricular rhythm ([facility])    a. Event monitor 05/2017:  48-hour Holter monitor showed 115 PVCs, 38 PACs, fluttering correlating with PVCs, 4 beat run and 3 beat run of slow idioventricular rhythm.  . Palpitations   . Premature atrial contractions   . PVC's (premature ventricular contractions)   . Tinnitus   . UTI (lower urinary tract infection)     Past Surgical History:  Procedure Laterality Date  . ABLATION    . CHOLECYSTECTOMY    . DILATION AND CURETTAGE OF UTERUS      Current Medications: Current Meds  Medication Sig  . loratadine (CLARITIN) 10 MG tablet Take 10 mg by mouth daily as needed for allergies.  Marland Kitchen losartan (COZAAR) 25 MG tablet Take 1 tablet (25 mg total) by mouth daily.  . mometasone (NASONEX) 50 MCG/ACT nasal spray Place 2 sprays into the nose daily as needed.   . pantoprazole (PROTONIX) 40 MG tablet Take 1 tablet by mouth daily.  Marland Kitchen PREMARIN vaginal cream Place vaginally.      Allergies:   Clindamycin/lincomycin, Penicillins, and Sulfa antibiotics   Social History   Socioeconomic History  . Marital status: Married    Spouse name: Not on file  . Number of children: Not on file  . Years of education: Not on file  . Highest education level: Not on file  Occupational History  . Not on file  Tobacco Use  . Smoking status: Never Smoker  . Smokeless tobacco: Never Used  Substance and Sexual Activity  . Alcohol use: No  . Drug use: No  . Sexual activity: Not on file  Other Topics Concern  . Not on file  Social History Narrative  . Not on file   Social Determinants of Health   Financial Resource Strain:   . Difficulty of Paying Living Expenses: Not on file  Food Insecurity:   . Worried About Programme researcher, broadcasting/film/video in the Last Year: Not on file  . Ran Out of Food in the Last Year: Not on file  Transportation Needs:   . Lack of Transportation (Medical): Not on file  . Lack of Transportation (Non-Medical): Not on file  Physical Activity:   . Days of Exercise per Week: Not on file  . Minutes of Exercise per Session: Not on file  Stress:   . Feeling of  Stress : Not on file  Social Connections:   . Frequency of Communication with Friends and Family: Not on file  . Frequency of Social Gatherings with Friends and Family: Not on file  . Attends Religious Services: Not on file  . Active Member of Clubs or Organizations: Not on file  . Attends Banker Meetings: Not on file  . Marital Status: Not on file     Family History: The patient's family history includes Breast cancer in her maternal aunt; Cancer - Ovarian in her paternal grandmother; Healthy in her father and mother.  ROS:   Please see the history of present illness.    No fevers chills nausea vomiting syncope bleeding all other systems reviewed and are negative.  EKGs/Labs/Other Studies Reviewed:     Recent Labs: 07/29/2020: BUN 11; Creatinine, Ser 0.82; Potassium 4.1; Sodium 139   Recent Lipid Panel No results found for: CHOL, TRIG, HDL, CHOLHDL, VLDL, LDLCALC, LDLDIRECT   Physical Exam:    VS:  BP (!) 150/90   Pulse 95   Ht 5\' 4"  (1.626 m)   Wt 179 lb 12.8 oz (81.6 kg)   SpO2 96%   BMI 30.86 kg/m     Wt Readings from Last 3 Encounters:  07/29/20 179 lb 12.8 oz (81.6 kg)  10/05/19 176 lb (79.8 kg)  06/19/18 171 lb 9.6 oz (77.8 kg)     GEN:  Well nourished, well developed in no acute distress HEENT: Normal NECK: No JVD; No carotid bruits LYMPHATICS: No lymphadenopathy CARDIAC: RRR, no murmurs, rubs, gallops RESPIRATORY:  Clear to auscultation without rales, wheezing or rhonchi  ABDOMEN: Soft, non-tender, non-distended MUSCULOSKELETAL:  No edema; No deformity  SKIN: Warm and dry NEUROLOGIC:  Alert and oriented x 3 PSYCHIATRIC:  Normal affect   ASSESSMENT:    1. Essential hypertension   2. Palpitation   3. Medication management    PLAN:    In order of problems listed above:  Essential hypertension -Losartan 25.  Excellent.  Blood pressures have significantly come down from the 161/94, 157/99 down to 113/76. -Check basic metabolic profile today. -She does have a degree of whitecoat hypertension.  Her pressures at home are excellent.  Palpitations -Occasionally will feel these, as her blood pressure improved.  Felt a few skips, rapid feeling heartbeats but when she checked her pulse it was normal on blood pressure cuff.  We will see her back in 1 year.    Shared Decision Making/Informed Consent      Medication Adjustments/Labs and Tests Ordered: Current medicines are reviewed at length with the patient today.  Concerns regarding medicines are outlined above.  Orders Placed This Encounter  Procedures  . Basic metabolic panel   No orders of the defined types were placed in this encounter.   Patient Instructions  Medication Instructions:  The current medical regimen is effective;  continue present plan and medications.  *If you  need a refill on your cardiac medications before your next appointment, please call your pharmacy*  Lab Work: Please have blood work today (BMP)  If you have labs (blood work) drawn today and your tests are completely normal, you will receive your results only by: 06/21/18 MyChart Message (if you have MyChart) OR . A paper copy in the mail If you have any lab test that is abnormal or we need to change your treatment, we will call you to review the results.  Follow-Up: At [facility], you and your health needs are our priority.  As part of our continuing mission to provide you with exceptional heart care, we have created designated Provider Care Teams.  These Care Teams include your primary Cardiologist (physician) and Advanced Practice Providers (APPs -  Physician Assistants and Nurse Practitioners) who all work together to provide you with the care you need, when you need it.  We recommend signing up for the patient portal called "MyChart".  Sign up information is provided on this After Visit Summary.  MyChart is used to connect with patients for Virtual Visits (Telemedicine).  Patients are able to view lab/test results, encounter notes, upcoming appointments, etc.  Non-urgent messages can be sent to your provider as well.   To learn more about what you can do with MyChart, go to [email or web address].    Your next appointment:   12 month(s)  The format for your next appointment:   In Person  Provider:   Donato Schultz, MD   Thank you for choosing [facility]!!         Signed, Donato Schultz, MD  08/02/2020 7:08 AM    [facility]

## 2020-07-29 NOTE — Patient Instructions (Signed)
Medication Instructions:  The current medical regimen is effective;  continue present plan and medications.  *If you need a refill on your cardiac medications before your next appointment, please call your pharmacy*  Lab Work: Please have blood work today (BMP)  If you have labs (blood work) drawn today and your tests are completely normal, you will receive your results only by: . MyChart Message (if you have MyChart) OR . A paper copy in the mail If you have any lab test that is abnormal or we need to change your treatment, we will call you to review the results.  Follow-Up: At [facility], you and your health needs are our priority.  As part of our continuing mission to provide you with exceptional heart care, we have created designated Provider Care Teams.  These Care Teams include your primary Cardiologist (physician) and Advanced Practice Providers (APPs -  Physician Assistants and Nurse Practitioners) who all work together to provide you with the care you need, when you need it.  We recommend signing up for the patient portal called "MyChart".  Sign up information is provided on this After Visit Summary.  MyChart is used to connect with patients for Virtual Visits (Telemedicine).  Patients are able to view lab/test results, encounter notes, upcoming appointments, etc.  Non-urgent messages can be sent to your provider as well.   To learn more about what you can do with MyChart, go to [email or web address].    Your next appointment:   12 month(s)  The format for your next appointment:   In Person  Provider:   Mark Skains, MD   Thank you for choosing [facility]!!      

## 2020-08-18 ENCOUNTER — Telehealth: Payer: Self-pay | Admitting: Cardiology

## 2020-08-18 NOTE — Telephone Encounter (Signed)
Spoke with pt who is reporting having muscle cramps/ spasms for the last 15 days.  She first noticed cramping below her knees and how having them in arms, back and upper legs that are worse with activity.  She reports feeling sore all over like she has been in a car accident.  She denies any muscle weakness, SOB, dry cough, trouble with urination etc.  Her only other complains are occasionally feeling dizzy and feeling "fragile."  She reports yesterday while walking out to her car from shopping she had dizziness/weird in her head and had actually stated to her daughter " I hope I'm not going to pass out."   It seems to resolve on its own.  She does not feel as though the dizziness is r/t position changes.  BPs has been 120s/70s and HR has been between 70 to 80 bpm.  Approximately 1 week ago she started taking 1/2 her normal dose of Losartan without any improvement in her s/s.  Advised to hold Losartan for now (usually takes it at bedtime).  I will have Dr Anne Fu to review and c/b with further instructions.  Pt in agreement and will await a call back

## 2020-08-18 NOTE — Telephone Encounter (Signed)
Pt c/o medication issue:  1. Name of Medication: losartan (COZAAR) 25 MG tablet  2. How are you currently taking this medication (dosage and times per day)?  Was taking as directed, started cutting tablet in half and taking half dosage about a week ago, per patient   3. Are you having a reaction (difficulty breathing--STAT)?  Yes, no difficulty breathing   4. What is your medication issue? Patient states she started taking medication around 4-5 weeks and around 2 weeks ago she started having muscle cramps, spasms in her arms and legs. Per patient within the last couple of days she has had some dizziness as well. Patient thinks it is this medication causing these side effects

## 2020-08-19 NOTE — Telephone Encounter (Signed)
OK to stop losartan Continue to monitor BP at home.  Donato Schultz, MD

## 2020-08-19 NOTE — Telephone Encounter (Signed)
Pt is aware to d/c losartan for now and see if her s/s resolve.  SHe will monitor her blood pressure at home and notify us if it should start to be elevated consistently.  She will let us know how she is feeling after being off Losartan in a few weeks.

## 2020-10-10 ENCOUNTER — Other Ambulatory Visit: Payer: Self-pay | Admitting: Obstetrics and Gynecology

## 2020-10-10 ENCOUNTER — Ambulatory Visit
Admission: RE | Admit: 2020-10-10 | Discharge: 2020-10-10 | Disposition: A | Discharge status: Home / Self Care | Payer: BC Managed Care – PPO | Source: Ambulatory Visit | Attending: Obstetrics and Gynecology | Admitting: Obstetrics and Gynecology

## 2020-10-10 ENCOUNTER — Other Ambulatory Visit: Payer: Self-pay

## 2020-10-10 DIAGNOSIS — Z1231 Encounter for screening mammogram for malignant neoplasm of breast: Secondary | ICD-10-CM

## 2020-10-24 ENCOUNTER — Other Ambulatory Visit: Payer: Self-pay | Admitting: Cardiology

## 2020-12-11 ENCOUNTER — Telehealth: Payer: Self-pay | Admitting: Cardiology

## 2020-12-11 NOTE — Telephone Encounter (Signed)
Pt c/o BP issue: STAT if pt c/o blurred vision, one-sided weakness or slurred speech  1. What are your last 5 BP readings? This morning 121/99, 126/90, 145/88, 130/95, 151/94 the past week since monday  2. Are you having any other symptoms (ex. Dizziness, headache, blurred vision, passed out)? Frontal lob headache, a little bit of dizziness  3. What is your BP issue? Patient states her BP has been high.

## 2020-12-11 NOTE — Telephone Encounter (Signed)
Returned call to patient regarding recent elevated BP readings. I asked her about previous problems with taking losartan 25 mg which she started In Oct/Nov 2021 and then subsequently stopped in late November due to muscle cramps. She has been monitoring BP regularly since that time and recently it has increased. She was also advised by her eye doctor to contact Dr. Anne Fu because there is evidence of high BP in the eyes. Patient states losartan worked well for her in every way except some muscle cramps that she experienced around the same time, but she is not certain that the cramping fully improved once she stopped losartan. States she is sensitive to many medications so she is agreeable to restart losartan at 12.5 mg daily until her appointment on Tuesday 3/29 with Dr. Anne Fu.

## 2020-12-16 ENCOUNTER — Ambulatory Visit (INDEPENDENT_AMBULATORY_CARE_PROVIDER_SITE_OTHER): Payer: BC Managed Care – PPO | Admitting: Cardiology

## 2020-12-16 ENCOUNTER — Encounter: Payer: Self-pay | Admitting: Cardiology

## 2020-12-16 ENCOUNTER — Other Ambulatory Visit: Payer: Self-pay

## 2020-12-16 VITALS — BP 140/90 | Ht 64.0 in | Wt 174.0 lb

## 2020-12-16 DIAGNOSIS — Z79899 Other long term (current) drug therapy: Secondary | ICD-10-CM

## 2020-12-16 DIAGNOSIS — I10 Essential (primary) hypertension: Secondary | ICD-10-CM

## 2020-12-16 DIAGNOSIS — R002 Palpitations: Secondary | ICD-10-CM

## 2020-12-16 MED ORDER — LOSARTAN POTASSIUM 25 MG PO TABS: 25.0000 mg | ORAL_TABLET | Freq: Every day | ORAL | 3 refills | Status: AC

## 2020-12-16 NOTE — Patient Instructions (Signed)
Medication Instructions:  The current medical regimen is effective;  continue present plan and medications. You may take Magnesium 200 mg a day for muscle cramping.  *If you need a refill on your cardiac medications before your next appointment, please call your pharmacy*  Follow-Up: At [facility], you and your health needs are our priority.  As part of our continuing mission to provide you with exceptional heart care, we have created designated Provider Care Teams.  These Care Teams include your primary Cardiologist (physician) and Advanced Practice Providers (APPs -  Physician Assistants and Nurse Practitioners) who all work together to provide you with the care you need, when you need it.  We recommend signing up for the patient portal called "MyChart".  Sign up information is provided on this After Visit Summary.  MyChart is used to connect with patients for Virtual Visits (Telemedicine).  Patients are able to view lab/test results, encounter notes, upcoming appointments, etc.  Non-urgent messages can be sent to your provider as well.   To learn more about what you can do with MyChart, go to [email or web address].    Your next appointment:   6 month(s)  The format for your next appointment:   In Person  Provider:   Donato Schultz, MD   Thank you for choosing [facility]!!

## 2020-12-16 NOTE — Progress Notes (Signed)
Cardiology Office Note:    Date:  12/16/2020   ID:  Kelsey Torres, DOB 1966-05-09, MRN [id number]  PCP:  Samuella Bruin   [facility]  Cardiologist:  Donato Schultz, MD  Advanced Practice Provider:  No care team member to display Electrophysiologist:  None       Referring MD: Corrington, Meredith Mody, MD     History of Present Illness:    Kelsey Torres is a 55 y.o. female here for the follow-up of hypertension.  History of palpitations with PVCs and PACs.  Recently called our office on 12/11/2020, note reviewed, with elevated blood pressure readings.  She has been taking her losartan 25 which she started in October 2021.  She then stopped it in late November because of muscle cramps.  She monitor her blood pressure regularly and then had it increased.  Her eye doctor said to contact us because there was some evidence of high blood pressure in her eyes.  She stated that losartan worked well in every way except for some muscle cramps that she experienced.  She is not fully certain that the muscle cramps stopped after following the losartan.  She is sensitive to many medications.  After that phone call she was agreeable to restart losartan 12.5 mg a day.  Cramping was knee down. Low back.   Echocardiogram normal EF. 2018 nuclear stress test was normal    Past Medical History:  Diagnosis Date  . Abnormal EKG    a. ETT-nuc 05/2017 - normal.  . Fatigue   . Headache    a. pt reports prior sensitivity to radiofrequencies from [employer] power meter in home.  Marland Kitchen Heartburn   . Idioventricular rhythm (HCC)    a. Event monitor 05/2017:  48-hour Holter monitor showed 115 PVCs, 38 PACs, fluttering correlating with PVCs, 4 beat run and 3 beat run of slow idioventricular rhythm.  . Palpitations   . Premature atrial contractions   . PVC's (premature ventricular contractions)   . Tinnitus   . UTI (lower urinary tract infection)     Past Surgical History:  Procedure  Laterality Date  . ABLATION    . CHOLECYSTECTOMY    . DILATION AND CURETTAGE OF UTERUS      Current Medications: Current Meds  Medication Sig  . loratadine (CLARITIN) 10 MG tablet Take 10 mg by mouth daily as needed for allergies.  . mometasone (NASONEX) 50 MCG/ACT nasal spray Place 2 sprays into the nose daily as needed.   . pantoprazole (PROTONIX) 40 MG tablet Take 1 tablet by mouth daily.  Marland Kitchen PREMARIN vaginal cream Place vaginally.  . [DISCONTINUED] losartan (COZAAR) 25 MG tablet Take 25 mg by mouth daily.     Allergies:   Clindamycin/lincomycin, Penicillins, and Sulfa antibiotics   Social History   Socioeconomic History  . Marital status: Married    Spouse name: Not on file  . Number of children: Not on file  . Years of education: Not on file  . Highest education level: Not on file  Occupational History  . Not on file  Tobacco Use  . Smoking status: Never Smoker  . Smokeless tobacco: Never Used  Substance and Sexual Activity  . Alcohol use: No  . Drug use: No  . Sexual activity: Not on file  Other Topics Concern  . Not on file  Social History Narrative  . Not on file   Social Determinants of Health   Financial Resource Strain: Not on file  Food Insecurity: Not on file  Transportation Needs: Not on file  Physical Activity: Not on file  Stress: Not on file  Social Connections: Not on file     Family History: The patient's family history includes Breast cancer in her maternal aunt; Cancer - Ovarian in her paternal grandmother; Healthy in her father and mother.  ROS:   Please see the history of present illness.    Denies any fevers chills nausea vomiting syncope bleeding all other systems reviewed and are negative.  EKGs/Labs/Other Studies Reviewed:    The following studies were reviewed today: As above  EKG:  EKG is  ordered today.  The ekg ordered today demonstrates sinus rhythm 73 with nonspecific ST-T wave changes, subtle inversion noted in  V3  Recent Labs: 07/29/2020: BUN 11; Creatinine, Ser 0.82; Potassium 4.1; Sodium 139  Recent Lipid Panel No results found for: CHOL, TRIG, HDL, CHOLHDL, VLDL, LDLCALC, LDLDIRECT   Risk Assessment/Calculations:      Physical Exam:    VS:  BP 140/90 (BP Location: Left Arm, Patient Position: Sitting, Cuff Size: Normal)   Ht 5\' 4"  (1.626 m)   Wt 174 lb (78.9 kg)   BMI 29.87 kg/m     Wt Readings from Last 3 Encounters:  12/16/20 174 lb (78.9 kg)  07/29/20 179 lb 12.8 oz (81.6 kg)  10/05/19 176 lb (79.8 kg)     GEN:  Well nourished, well developed in no acute distress HEENT: Normal NECK: No JVD; No carotid bruits LYMPHATICS: No lymphadenopathy CARDIAC: RRR, no murmurs, rubs, gallops RESPIRATORY:  Clear to auscultation without rales, wheezing or rhonchi  ABDOMEN: Soft, non-tender, non-distended MUSCULOSKELETAL:  No edema; No deformity  SKIN: Warm and dry NEUROLOGIC:  Alert and oriented x 3 PSYCHIATRIC:  Normal affect   ASSESSMENT:    1. Essential hypertension   2. Palpitation   3. Medication management    PLAN:    In order of problems listed above:  Essential hypertension -On losartan 12.5mg .  Has whitecoat hypertension.  Pressures at home are usually excellent.  She showed me several readings.  She thinks that a steroid treatment for sinus infection prompted her hypertension.  This is possible.  Her eye doctor however did show some bleeding in the back of her eye or some changes that were suggestive of hypertension.  Expressed the utmost importance of maintaining good blood pressures.  Since she has started back the losartan 12.5 mg once a day these have improved.  She does still have some lower extremity cramping at times.  This is a very extremely rare side effect with losartan.  I suggested her taking magnesium supplement 200 mg.  Banana a day would be helpful or perhaps an orange.  And daily walking 20 to 30 minutes a day will help stretch those  muscles.  Palpitations -Occasionally will feel PVCs PACs.  Prior monitoring demonstrated this.  Sometimes will feel pounding of her heart.  When she checked her blood pressure it was 81 bpm.  Reassurance given to her.  6 month follow-up.       Medication Adjustments/Labs and Tests Ordered: Current medicines are reviewed at length with the patient today.  Concerns regarding medicines are outlined above.  Orders Placed This Encounter  Procedures  . EKG 12-Lead   Meds ordered this encounter  Medications  . losartan (COZAAR) 25 MG tablet    Sig: Take 1 tablet (25 mg total) by mouth daily.    Dispense:  90 tablet    Refill:  3    Patient Instructions  Medication Instructions:  The current medical regimen is effective;  continue present plan and medications. You may take Magnesium 200 mg a day for muscle cramping.  *If you need a refill on your cardiac medications before your next appointment, please call your pharmacy*  Follow-Up: At [facility], you and your health needs are our priority.  As part of our continuing mission to provide you with exceptional heart care, we have created designated Provider Care Teams.  These Care Teams include your primary Cardiologist (physician) and Advanced Practice Providers (APPs -  Physician Assistants and Nurse Practitioners) who all work together to provide you with the care you need, when you need it.  We recommend signing up for the patient portal called "MyChart".  Sign up information is provided on this After Visit Summary.  MyChart is used to connect with patients for Virtual Visits (Telemedicine).  Patients are able to view lab/test results, encounter notes, upcoming appointments, etc.  Non-urgent messages can be sent to your provider as well.   To learn more about what you can do with MyChart, go to [email or web address].    Your next appointment:   6 month(s)  The format for your next appointment:   In Person  Provider:   Donato Schultz, MD   Thank you for choosing [facility]!!        Signed, Donato Schultz, MD  12/16/2020 9:35 AM    [facility]

## 2021-06-19 ENCOUNTER — Ambulatory Visit: Payer: BC Managed Care – PPO | Admitting: Cardiology

## 2021-11-23 DIAGNOSIS — H6991 Unspecified Eustachian tube disorder, right ear: Secondary | ICD-10-CM | POA: Insufficient documentation

## 2022-06-29 IMAGING — MG MM DIGITAL SCREENING BILAT W/ TOMO AND CAD
8 series · 8 of 24 positions shown · non-contrast
Comparison: Previous exam(s).

CLINICAL DATA: Screening.

EXAM:
DIGITAL SCREENING BILATERAL MAMMOGRAM WITH TOMO AND CAD

[L CC synth-2D]
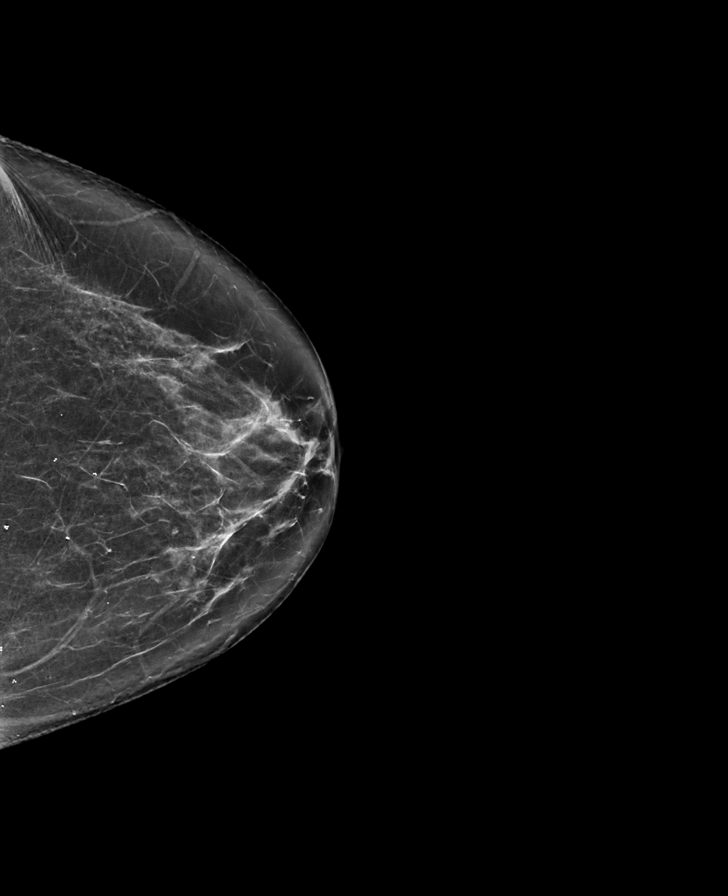

[R MLO synth-2D]
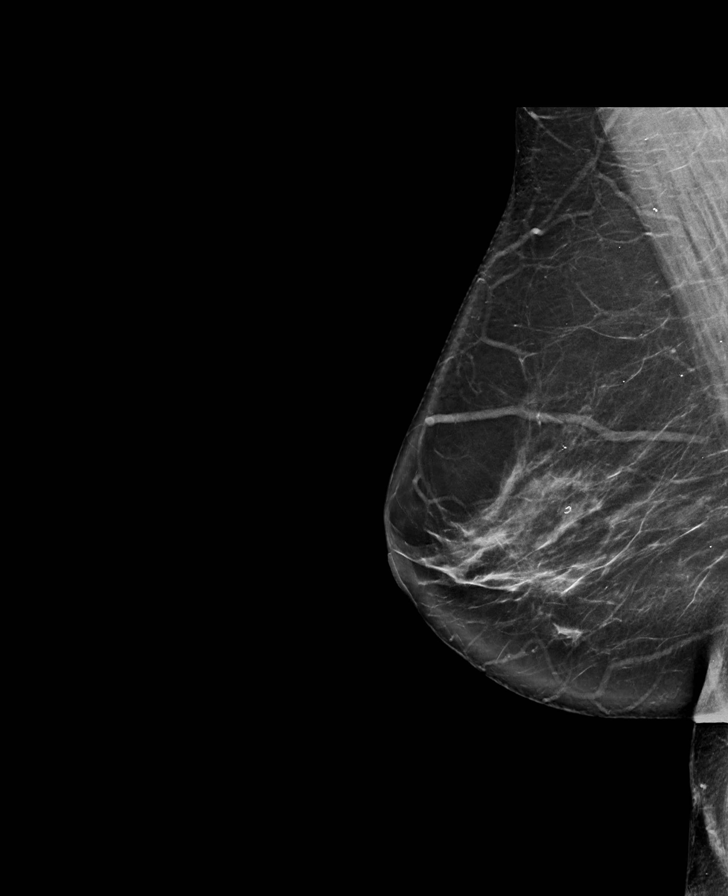

[L MLO synth-2D]
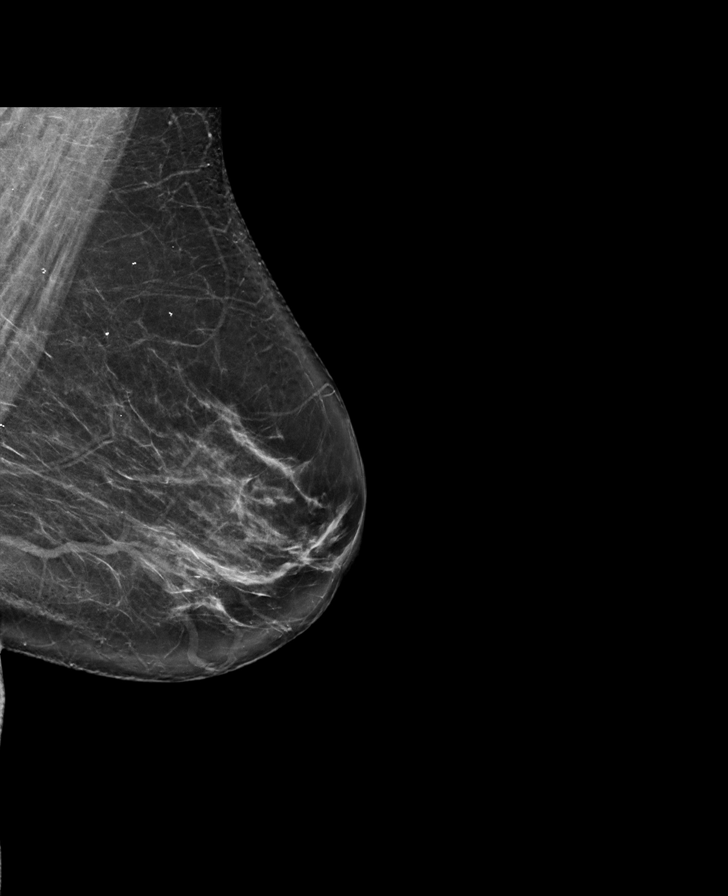

[R CC synth-2D]
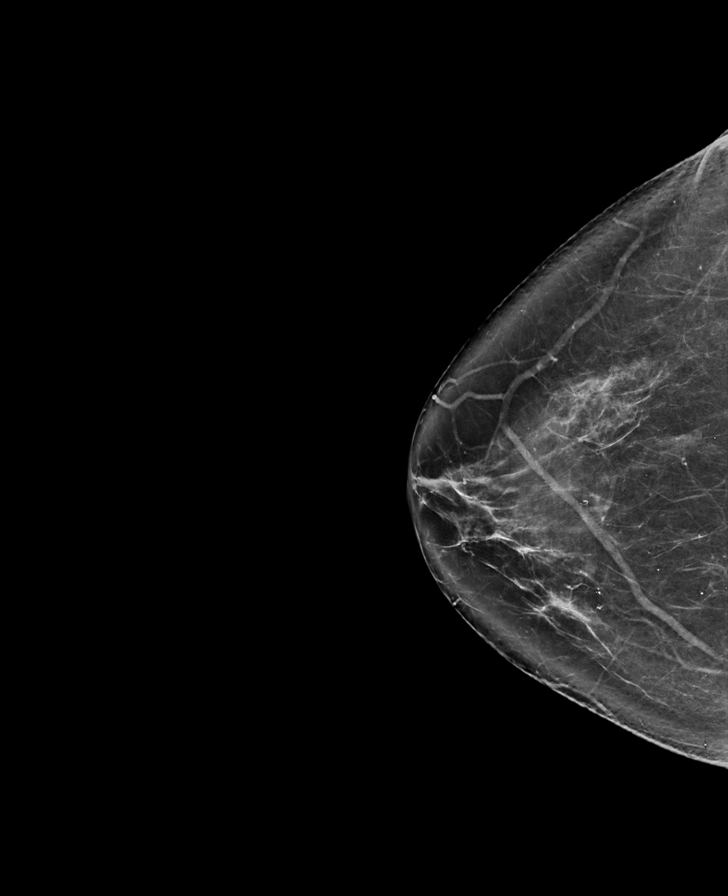

[L MLO tomo · tomo slice 40/79.0]
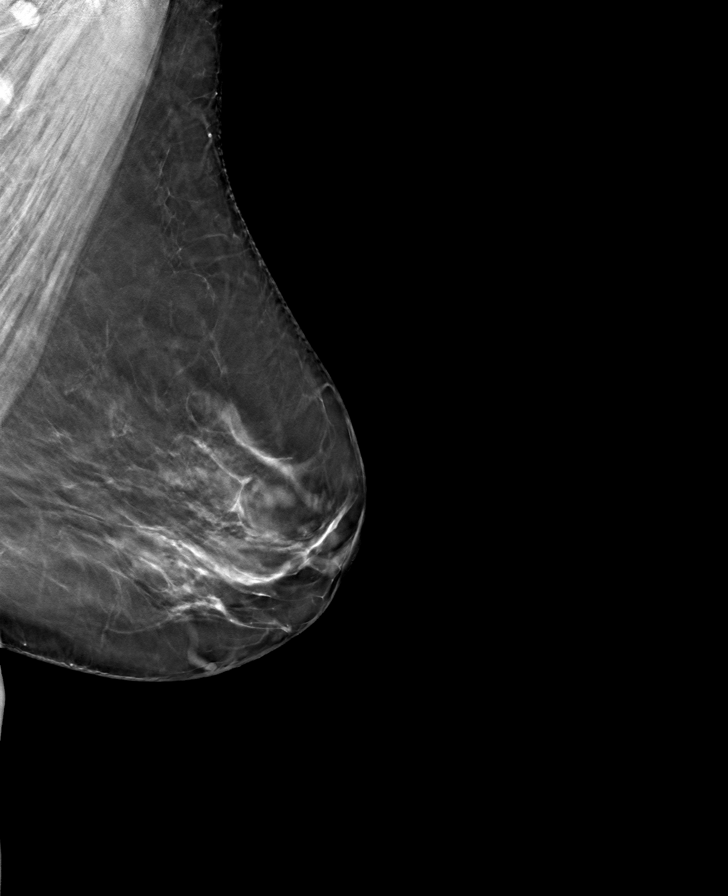

[R MLO tomo · tomo slice 42/83.0]
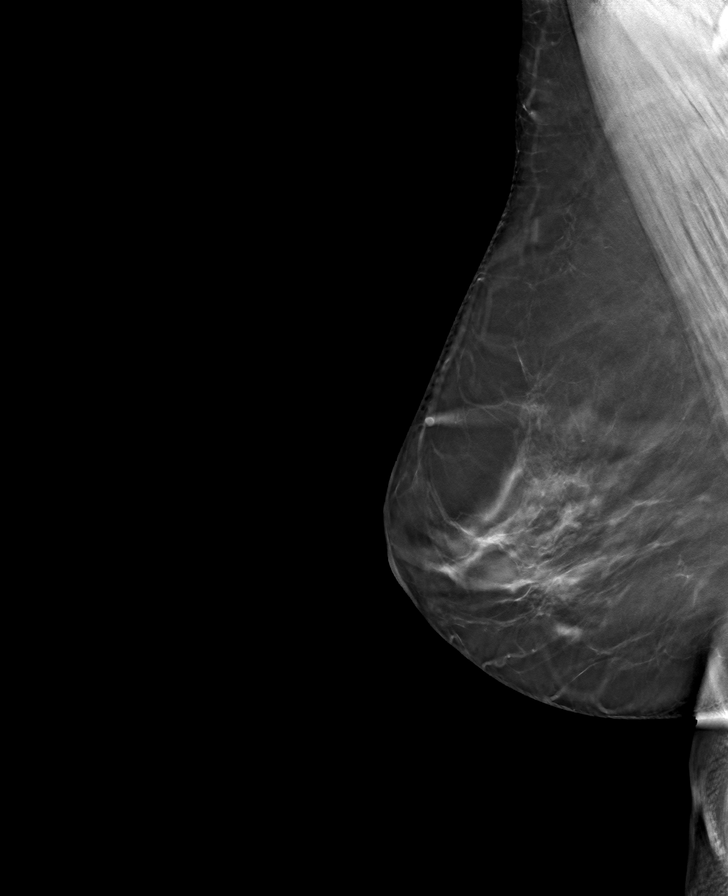

[L CC tomo · tomo slice 39/76.0]
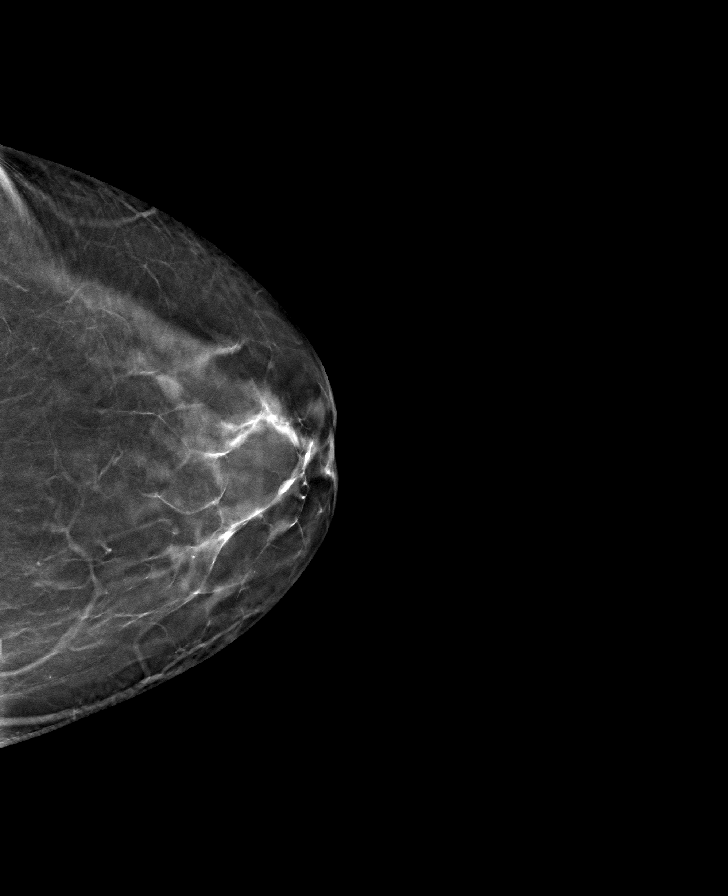

[R CC tomo · tomo slice 41/82.0]
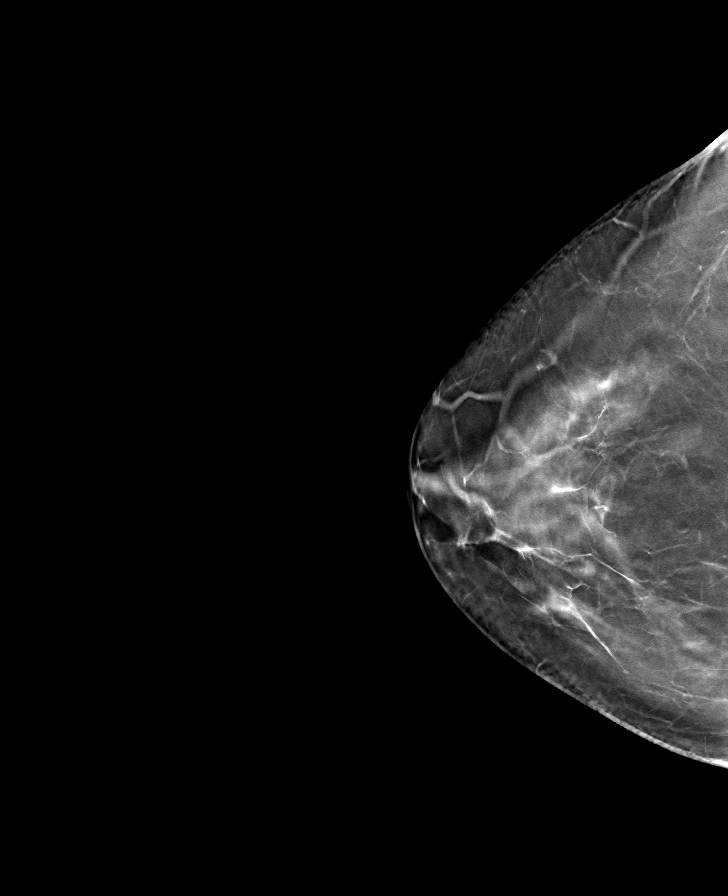

[8 of 24 positions shown; findings below may reference images not displayed]

ACR Breast Density Category b: There are scattered areas of
fibroglandular density.
FINDINGS: There are no findings suspicious for malignancy. The images were
evaluated with computer-aided detection.
IMPRESSION: No mammographic evidence of malignancy. A result letter of this
screening mammogram will be mailed directly to the patient.

RECOMMENDATION:
Screening mammogram in one year. (Code:ZP-7-VX7)

BI-RADS CATEGORY  1: Negative.

## 2023-05-07 DIAGNOSIS — M7501 Adhesive capsulitis of right shoulder: Secondary | ICD-10-CM | POA: Insufficient documentation

## 2024-03-14 ENCOUNTER — Encounter: Payer: Self-pay | Admitting: *Deleted

## 2024-03-14 ENCOUNTER — Other Ambulatory Visit: Payer: Self-pay | Admitting: *Deleted

## 2024-03-27 ENCOUNTER — Telehealth: Payer: Self-pay | Admitting: *Deleted

## 2024-03-27 NOTE — Progress Notes (Signed)
 Complex Care Management Care Guide Note  03/27/2024 Name: Kelsey Torres MRN: [id number] DOB: 01-18-1966  Kelsey Torres is a 58 y.o. year old female who is a primary care patient of Mann, Benjamin L, PA-C and is actively engaged with the care management team. I reached out to Kelsey Torres by phone today to assist with canceling   with the RN Case Manager.Patient declines to reschedule. No further outreach attempts will be made at this time.  Follow up plan: none  Harlene Satterfield  [facility], [facility] Guide  Direct Dial: [phone]  Fax [phone]

## 2024-03-28 ENCOUNTER — Telehealth: Payer: Self-pay | Admitting: *Deleted

## 2024-04-19 ENCOUNTER — Other Ambulatory Visit: Payer: Self-pay | Admitting: Obstetrics and Gynecology

## 2024-04-19 DIAGNOSIS — Z1231 Encounter for screening mammogram for malignant neoplasm of breast: Secondary | ICD-10-CM

## 2024-04-20 ENCOUNTER — Ambulatory Visit
Admission: RE | Admit: 2024-04-20 | Discharge: 2024-04-20 | Disposition: A | Discharge status: Home / Self Care | Source: Ambulatory Visit | Attending: Obstetrics and Gynecology | Admitting: Obstetrics and Gynecology

## 2024-04-20 DIAGNOSIS — Z1231 Encounter for screening mammogram for malignant neoplasm of breast: Secondary | ICD-10-CM

## 2024-04-26 ENCOUNTER — Other Ambulatory Visit: Payer: Self-pay | Admitting: Obstetrics and Gynecology

## 2024-04-26 DIAGNOSIS — R928 Other abnormal and inconclusive findings on diagnostic imaging of breast: Secondary | ICD-10-CM

## 2024-05-01 ENCOUNTER — Ambulatory Visit
Admission: RE | Admit: 2024-05-01 | Discharge: 2024-05-01 | Disposition: A | Discharge status: Home / Self Care | Source: Ambulatory Visit | Attending: Obstetrics and Gynecology | Admitting: Obstetrics and Gynecology

## 2024-05-01 DIAGNOSIS — R928 Other abnormal and inconclusive findings on diagnostic imaging of breast: Secondary | ICD-10-CM
